# Patient Record
Sex: Male | Born: 1960 | Race: White | Hispanic: No | Marital: Married | Smoking: Never smoker
Health system: Southern US, Community
[De-identification: ages and names within clinical notes are randomized; demographics above are authoritative.]

## PROBLEM LIST (undated history)

## (undated) DIAGNOSIS — D649 Anemia, unspecified: Secondary | ICD-10-CM

## (undated) DIAGNOSIS — T7840XA Allergy, unspecified, initial encounter: Secondary | ICD-10-CM

## (undated) DIAGNOSIS — M5020 Other cervical disc displacement, unspecified cervical region: Secondary | ICD-10-CM

## (undated) DIAGNOSIS — M1990 Unspecified osteoarthritis, unspecified site: Secondary | ICD-10-CM

## (undated) DIAGNOSIS — F419 Anxiety disorder, unspecified: Secondary | ICD-10-CM

## (undated) DIAGNOSIS — K219 Gastro-esophageal reflux disease without esophagitis: Secondary | ICD-10-CM

## (undated) DIAGNOSIS — G4700 Insomnia, unspecified: Secondary | ICD-10-CM

## (undated) DIAGNOSIS — F32A Depression, unspecified: Secondary | ICD-10-CM

## (undated) HISTORY — DX: Gastro-esophageal reflux disease without esophagitis: K21.9

## (undated) HISTORY — DX: Insomnia, unspecified: G47.00

## (undated) HISTORY — DX: Depression, unspecified: F32.A

## (undated) HISTORY — DX: Allergy, unspecified, initial encounter: T78.40XA

## (undated) HISTORY — DX: Anxiety disorder, unspecified: F41.9

## (undated) HISTORY — PX: VASECTOMY: SHX75

## (undated) HISTORY — DX: Unspecified osteoarthritis, unspecified site: M19.90

## (undated) HISTORY — DX: Anemia, unspecified: D64.9

---

## 2020-12-27 ENCOUNTER — Other Ambulatory Visit: Payer: Self-pay

## 2020-12-27 ENCOUNTER — Emergency Department (HOSPITAL_COMMUNITY)
Admission: EM | Admit: 2020-12-27 | Discharge: 2020-12-28 | Disposition: A | Discharge status: Home / Self Care | Payer: Self-pay | Attending: Emergency Medicine | Admitting: Emergency Medicine

## 2020-12-27 ENCOUNTER — Encounter (HOSPITAL_COMMUNITY): Payer: Self-pay

## 2020-12-27 DIAGNOSIS — N201 Calculus of ureter: Secondary | ICD-10-CM | POA: Insufficient documentation

## 2020-12-27 DIAGNOSIS — N1330 Unspecified hydronephrosis: Secondary | ICD-10-CM | POA: Insufficient documentation

## 2020-12-27 DIAGNOSIS — N281 Cyst of kidney, acquired: Secondary | ICD-10-CM | POA: Insufficient documentation

## 2020-12-27 DIAGNOSIS — M549 Dorsalgia, unspecified: Secondary | ICD-10-CM

## 2020-12-27 DIAGNOSIS — R17 Unspecified jaundice: Secondary | ICD-10-CM

## 2020-12-27 DIAGNOSIS — R748 Abnormal levels of other serum enzymes: Secondary | ICD-10-CM | POA: Insufficient documentation

## 2020-12-27 HISTORY — DX: Other cervical disc displacement, unspecified cervical region: M50.20

## 2020-12-27 MED ORDER — ACETAMINOPHEN 325 MG PO TABS
650.0000 mg | ORAL_TABLET | Freq: Once | ORAL | Status: AC
  Administered 2020-12-27: 650 mg via ORAL
  Filled 2020-12-27: qty 2

## 2020-12-27 NOTE — ED Triage Notes (Signed)
Lower back pain radiating to the front when pt turned around in bed. Pt reports its the same feeling when he had a slip disc before. Pain 10/10

## 2020-12-28 ENCOUNTER — Emergency Department (HOSPITAL_COMMUNITY): Payer: Self-pay

## 2020-12-28 LAB — URINALYSIS, ROUTINE W REFLEX MICROSCOPIC
Bacteria, UA: NONE SEEN
Bilirubin Urine: NEGATIVE
Glucose, UA: NEGATIVE mg/dL
Ketones, ur: 80 mg/dL — AB
Leukocytes,Ua: NEGATIVE
Nitrite: NEGATIVE
Protein, ur: NEGATIVE mg/dL
Specific Gravity, Urine: 1.023 (ref 1.005–1.030)
pH: 5 (ref 5.0–8.0)

## 2020-12-28 LAB — COMPREHENSIVE METABOLIC PANEL
ALT: 29 U/L (ref 0–44)
AST: 26 U/L (ref 15–41)
Albumin: 3.7 g/dL (ref 3.5–5.0)
Alkaline Phosphatase: 61 U/L (ref 38–126)
Anion gap: 9 (ref 5–15)
BUN: 11 mg/dL (ref 6–20)
CO2: 24 mmol/L (ref 22–32)
Calcium: 9.3 mg/dL (ref 8.9–10.3)
Chloride: 105 mmol/L (ref 98–111)
Creatinine, Ser: 1.26 mg/dL — ABNORMAL HIGH (ref 0.61–1.24)
GFR, Estimated: >60 mL/min (ref >60)
Glucose, Bld: 108 mg/dL — ABNORMAL HIGH (ref 70–99)
Potassium: 3.8 mmol/L (ref 3.5–5.1)
Sodium: 138 mmol/L (ref 135–145)
Total Bilirubin: 2.7 mg/dL — ABNORMAL HIGH (ref 0.3–1.2)
Total Protein: 5.7 g/dL — ABNORMAL LOW (ref 6.5–8.1)

## 2020-12-28 LAB — CBC WITH DIFFERENTIAL/PLATELET
Abs Immature Granulocytes: 0.05 10*3/uL (ref 0.00–0.07)
Basophils Absolute: 0 10*3/uL (ref 0.0–0.1)
Basophils Relative: 0 %
Eosinophils Absolute: 0 10*3/uL (ref 0.0–0.5)
Eosinophils Relative: 0 %
HCT: 32.5 % — ABNORMAL LOW (ref 39.0–52.0)
Hemoglobin: 10.4 g/dL — ABNORMAL LOW (ref 13.0–17.0)
Immature Granulocytes: 0 %
Lymphocytes Relative: 10 %
Lymphs Abs: 1.1 10*3/uL (ref 0.7–4.0)
MCH: 21.5 pg — ABNORMAL LOW (ref 26.0–34.0)
MCHC: 32 g/dL (ref 30.0–36.0)
MCV: 67.1 fL — ABNORMAL LOW (ref 80.0–100.0)
Monocytes Absolute: 0.6 10*3/uL (ref 0.1–1.0)
Monocytes Relative: 6 %
Neutro Abs: 9.4 10*3/uL — ABNORMAL HIGH (ref 1.7–7.7)
Neutrophils Relative %: 84 %
Platelets: 176 10*3/uL (ref 150–400)
RBC: 4.84 MIL/uL (ref 4.22–5.81)
RDW: 16.9 % — ABNORMAL HIGH (ref 11.5–15.5)
WBC: 11.2 10*3/uL — ABNORMAL HIGH (ref 4.0–10.5)
nRBC: 0.2 % (ref 0.0–0.2)

## 2020-12-28 LAB — LIPASE, BLOOD: Lipase: 55 U/L — ABNORMAL HIGH (ref 11–51)

## 2020-12-28 MED ORDER — HYDROCODONE-ACETAMINOPHEN 7.5-325 MG PO TABS: 1.0000 | ORAL_TABLET | Freq: Four times a day (QID) | ORAL | 0 refills | Status: DC | PRN

## 2020-12-28 MED ORDER — IOHEXOL 300 MG/ML  SOLN
100.0000 mL | Freq: Once | INTRAMUSCULAR | Status: AC | PRN
  Administered 2020-12-28: 100 mL via INTRAVENOUS

## 2020-12-28 MED ORDER — MORPHINE SULFATE (PF) 4 MG/ML IV SOLN
4.0000 mg | Freq: Once | INTRAVENOUS | Status: AC
  Administered 2020-12-28: 4 mg via INTRAVENOUS
  Filled 2020-12-28: qty 1

## 2020-12-28 MED ORDER — ONDANSETRON 4 MG PO TBDP: 4.0000 mg | ORAL_TABLET | Freq: Three times a day (TID) | ORAL | 0 refills | Status: DC | PRN

## 2020-12-28 MED ORDER — SODIUM CHLORIDE 0.9 % IV BOLUS
1000.0000 mL | Freq: Once | INTRAVENOUS | Status: AC
Start: 2020-12-28 — End: 2020-12-28
  Administered 2020-12-28: 1000 mL via INTRAVENOUS

## 2020-12-28 MED ORDER — HYDROCODONE-ACETAMINOPHEN 5-325 MG PO TABS: 1.0000 | ORAL_TABLET | Freq: Four times a day (QID) | ORAL | 0 refills | Status: DC | PRN

## 2020-12-28 MED ORDER — LIDOCAINE 5 % EX PTCH: 1.0000 | MEDICATED_PATCH | CUTANEOUS | 0 refills | Status: DC

## 2020-12-28 MED ORDER — ONDANSETRON HCL 4 MG/2ML IJ SOLN
4.0000 mg | Freq: Once | INTRAMUSCULAR | Status: AC
  Administered 2020-12-28: 4 mg via INTRAVENOUS
  Filled 2020-12-28: qty 2

## 2020-12-28 MED ORDER — TAMSULOSIN HCL 0.4 MG PO CAPS: 0.4000 mg | ORAL_CAPSULE | Freq: Every day | ORAL | 0 refills | Status: DC

## 2020-12-28 NOTE — ED Notes (Signed)
Pt walked to the bathroom well with a steady gait.

## 2020-12-28 NOTE — ED Provider Notes (Signed)
[facility] EMERGENCY DEPARTMENT Provider Note   CSN: [id number] Arrival date & time: 12/27/20  2144    History Chief Complaint  Patient presents with   Back Pain    Fernando Arias is a 60 y.o. male with past medical history significant for prior cervical herniated disc who presents for evaluation of back and abdominal pain.  States yesterday when he was clocking into work he had sudden onset thoracic back pain that radiated to his abdomen.  States he thought this was due to his new job.  Patient states he laid down and his pain persisted.  Rates his pain a 10 out of 10 and described it as sharp and stabbing.  He has never had a pain like this previously.  He assumed he might have pulled a muscle in his back.  He came to the emergency department due to pain being unresolved.  He denies any fever, chills, nausea, vomiting, chest pain, shortness of breath, weakness, paresthesias, lightheadedness or dizziness.  Denies additional aggravating or alleviating factors.  Has any prior history of kidney stones, AAA or dissections.  Patient denies any pain radiating to his bilateral legs.  No history of IV drug use, bowel or bladder incontinence, saddle paresthesias. No lumbar back pain.  History obtained from patient andast medical records.  No interpreter used.  HPI     Past Medical History:  Diagnosis Date   Slipped disc in neck     There are no problems to display for this patient.   History reviewed. No pertinent surgical history.     History reviewed. No pertinent family history.  Social History   Tobacco Use   Smoking status: Never Smoker   Smokeless tobacco: Never Used  Substance Use Topics   Alcohol use: Never   Drug use: Never    Home Medications Prior to Admission medications   Medication Sig Start Date End Date Taking? Authorizing Provider  Aspirin-Salicylamide-Caffeine (BC HEADACHE POWDER PO) Take 1 packet by mouth daily as needed.   Yes  [provider]  calcium carbonate (OS-CAL) 1250 (500 Ca) MG chewable tablet Chew 1 tablet by mouth daily.   Yes [provider]  cholecalciferol (VITAMIN D3) 25 MCG (1000 UNIT) tablet Take 1,000 Units by mouth daily.   Yes [provider]  HYDROcodone-acetaminophen (NORCO) 7.5-325 MG tablet Take 1 tablet by mouth every 6 (six) hours as needed for moderate pain. 12/28/20  Yes Patrese Neal A, PA-C  lidocaine (LIDODERM) 5 % Place 1 patch onto the skin daily. Remove & Discard patch within 12 hours or as directed by MD 12/28/20  Yes Dyan Creelman A, PA-C  Multiple Vitamins-Minerals (CENTRUM SILVER 50+MEN) TABS Take 1 tablet by mouth daily.   Yes [provider]  ondansetron (ZOFRAN ODT) 4 MG disintegrating tablet Take 1 tablet (4 mg total) by mouth every 8 (eight) hours as needed for nausea or vomiting. 12/28/20  Yes Jaxton Casale A, PA-C  tamsulosin (FLOMAX) 0.4 MG CAPS capsule Take 1 capsule (0.4 mg total) by mouth daily. 12/28/20  Yes Fenris Cauble A, PA-C    Allergies    Patient has no known allergies.  Review of Systems   Review of Systems  Constitutional: Negative.   HENT: Negative.   Eyes: Negative.   Respiratory: Negative.   Cardiovascular: Negative.   Gastrointestinal: Positive for abdominal pain. Negative for constipation, diarrhea, nausea, rectal pain and vomiting.  Genitourinary: Negative.   Musculoskeletal: Positive for back pain. Negative for neck pain and neck  stiffness.  Skin: Negative.   Neurological: Negative.   All other systems reviewed and are negative.   Physical Exam Updated Vital Signs BP 138/72    Pulse 76    Temp 97.9 F (36.6 C) (Oral)    Resp 16    Ht  (1.88 m)    Wt 107.5 kg    SpO2 98%    BMI 30.43 kg/m   Physical Exam Vitals and nursing note reviewed.  Constitutional:      General: He is not in acute distress.    Appearance: He is well-developed. He is not ill-appearing, toxic-appearing or diaphoretic.   HENT:     Head: Normocephalic and atraumatic.     Nose: Nose normal.     Mouth/Throat:     Mouth: Mucous membranes are moist.  Eyes:     Pupils: Pupils are equal, round, and reactive to light.  Cardiovascular:     Rate and Rhythm: Normal rate and regular rhythm.     Pulses: Normal pulses.          Radial pulses are 2+ on the right side and 2+ on the left side.       Dorsalis pedis pulses are 2+ on the right side and 2+ on the left side.     Heart sounds: Normal heart sounds.  Pulmonary:     Effort: Pulmonary effort is normal. No respiratory distress.     Breath sounds: Normal breath sounds.  Abdominal:     General: Bowel sounds are normal. There is no distension.     Palpations: Abdomen is soft.     Tenderness: There is generalized abdominal tenderness. There is no right CVA tenderness, left CVA tenderness, guarding or rebound. Negative signs include Murphy's sign and McBurney's sign.     Hernia: No hernia is present.       Comments: Denies abdominal tenderness, or worse to right.  Negative Murphy sign, negative McBurney point.  Old surgical scar without erythema or warmth to right abd  Musculoskeletal:        General: Normal range of motion.     Cervical back: Normal range of motion and neck supple.       Back:     Comments: No midline tenderness. Mid right para spinal thoracic over T10-T12. No tender over lumbar area. No overlying skin changes. Negative straight leg raise.   Feet:     Right foot:     Skin integrity: Skin integrity normal.     Left foot:     Skin integrity: Skin integrity normal.  Skin:    General: Skin is warm and dry.     Capillary Refill: Capillary refill takes less than 2 seconds.     Coloration: Skin is pale.     Comments: No rashes or lesions.  Neurological:     General: No focal deficit present.     Mental Status: He is alert and oriented to person, place, and time.     Cranial Nerves: Cranial nerves are intact.     Sensory: Sensation is intact.      Motor: Motor function is intact.     Gait: Gait is intact.     Comments: Cranial nerves II to grossly intact Ambulatory without difficulty    ED Results / Procedures / Treatments   Labs (all labs ordered are listed, but only abnormal results are displayed) Labs Reviewed  CBC WITH DIFFERENTIAL/PLATELET - Abnormal; Notable for the following components:      Result  Value   WBC 11.2 (*)    Hemoglobin 10.4 (*)    HCT 32.5 (*)    MCV 67.1 (*)    MCH 21.5 (*)    RDW 16.9 (*)    Neutro Abs 9.4 (*)    All other components within normal limits  COMPREHENSIVE METABOLIC PANEL - Abnormal; Notable for the following components:   Glucose, Bld 108 (*)    Creatinine, Ser 1.26 (*)    Total Protein 5.7 (*)    Total Bilirubin 2.7 (*)    All other components within normal limits  LIPASE, BLOOD - Abnormal; Notable for the following components:   Lipase 55 (*)    All other components within normal limits  URINALYSIS, ROUTINE W REFLEX MICROSCOPIC - Abnormal; Notable for the following components:   Hgb urine dipstick MODERATE (*)    Ketones, ur 80 (*)    All other components within normal limits    EKG EKG Interpretation  Date/Time:  Friday December 28 2020 08:08:35 EDT Ventricular Rate:  62 PR Interval:  144 QRS Duration: 104 QT Interval:  438 QTC Calculation: 444 R Axis:   -5 Text Interpretation: Normal sinus rhythm normal, no acute ischemic appearance, no old comparison Confirmed by Arby Barrette [phone]) on 12/28/2020 9:21:15 AM   Radiology CT THORACIC SPINE WO CONTRAST  Result Date: 12/28/2020 CLINICAL DATA:  Diffuse abdominal and back pain.  No known trauma. EXAM: CT THORACIC SPINE WITHOUT CONTRAST TECHNIQUE: Multidetector CT images of the thoracic were obtained using the standard protocol without intravenous contrast. COMPARISON:  None. FINDINGS: Alignment: Normal Vertebrae: No acute bony findings. No fracture or bone lesion. The facets are normally aligned. No facet or lamina  fractures. Moderate degenerative changes with near bridging anterior osteophytes in the mid and lower thoracic spine. Paraspinal and other soft tissues: No significant paraspinal findings. No mediastinal mass or adenopathy. The aorta is normal in caliber. There is moderate distal esophageal wall thickening which could suggest esophagitis. Recommend correlation with any symptoms of dysphagia. Contrast esophagram may be helpful for further evaluation. No definite hiatal hernia. There are simple appearing cysts associated with the right kidney. Right renal calculi are also noted. The visualized lungs are grossly clear. No worrisome pulmonary lesions are identified. The visualized ribs are intact.  No rib fractures. Disc levels: No large thoracic disc protrusions, significant spinal or foraminal stenosis demonstrated. Mild degenerative type spurring changes are noted. IMPRESSION: 1. Normal alignment of the thoracic vertebral bodies and no acute bony findings. 2. Moderate degenerative changes with near bridging anterior osteophytes in the mid and lower thoracic spine. 3. No large thoracic disc protrusions, significant spinal or foraminal stenosis. 4. Moderate distal esophageal wall thickening could suggest esophagitis. Electronically Signed   By: Rudie Meyer M.D.   On: 12/28/2020 08:50   CT ABDOMEN PELVIS W CONTRAST  Result Date: 12/28/2020 CLINICAL DATA:  Acute abdominal pain. EXAM: CT ABDOMEN AND PELVIS WITH CONTRAST TECHNIQUE: Multidetector CT imaging of the abdomen and pelvis was performed using the standard protocol following bolus administration of intravenous contrast. CONTRAST:  OMNIPAQUE IOHEXOL 300 MG/ML  SOLN COMPARISON:  None. FINDINGS: Lower chest: The lung bases are unremarkable. No worrisome pulmonary lesions or pleural effusions. The heart is normal in size. No pericardial effusion. There is moderate distal esophageal wall thickening which could suggest esophagitis. Recommend correlation any  clinical symptoms esophagitis. Contrast esophagram may be helpful for further evaluation. Hepatobiliary: No hepatic lesions or intrahepatic biliary dilatation. I do not see any morphologic findings  for cirrhosis. The gallbladder is unremarkable. No common bile duct dilatation. Pancreas: No mass, inflammation or ductal dilatation. Spleen: Mild splenomegaly. Spleen measures 14 x 14 x 10 cm. No focal lesions. Adrenals/Urinary Tract: The adrenal glands are normal. There are right-sided simple appearing renal cysts. The largest measures 6 cm in the upper pole area. There are also small right renal calculi. No left-sided renal calculi. Pararenal cysts are noted. Moderate right-sided hydronephrosis and mild to moderate right-sided hydroureter down to an obstructing 1-2 mm calculus in the distal ureter located at the mid sacral level. I do not see any findings to suggest pyelonephritis. The bladder is unremarkable. No bladder calculi or findings to suggest cystitis. Stomach/Bowel: The stomach, duodenum, small bowel and colon are grossly normal without oral contrast. No acute inflammatory changes, mass lesions or obstructive findings. The terminal ileum and appendix are normal. There is moderate descending colon and sigmoid colon diverticulosis but no findings for acute diverticulitis. Vascular/Lymphatic: The aorta and branch vessels are patent. Minimal scattered calcifications. The major venous structures are patent. Hazy interstitial changes in the root of the small bowel mesentery and scattered mesenteric nodes consistent with benign mesenteritis or panniculitis. Small scattered retroperitoneal lymph nodes but no mass or adenopathy. Reproductive: The prostate gland and seminal vesicles are unremarkable. Other: No pelvic mass or adenopathy. No free pelvic fluid collections. No inguinal mass or adenopathy. No abdominal wall hernia or subcutaneous lesions. Musculoskeletal: No significant bony findings. Moderate age advanced  degenerative lumbar spondylosis with disc disease and facet disease in the lower lumbar spine. Mild bilateral hip joint degenerative changes are also noted. IMPRESSION: 1. 1-2 mm obstructing distal right ureteral calculus causing moderate right-sided hydronephrosis and mild to moderate right-sided hydroureter. 2. Small right renal calculi. 3. Bilateral renal cysts. 4. Mild splenomegaly. 5. Moderate distal esophageal wall thickening could suggest esophagitis. Recommend correlation any clinical symptoms. 6. Hazy interstitial changes in the root of the small bowel mesentery and scattered mesenteric nodes consistent with benign mesenteritis or panniculitis. Electronically Signed   By: Rudie Meyer M.D.   On: 12/28/2020 09:02    Procedures Procedures   Medications Ordered in ED Medications  acetaminophen (TYLENOL) tablet 650 mg (650 mg Oral Given 12/27/20 2207)  sodium chloride 0.9 % bolus 1,000 mL (0 mLs Intravenous Stopped 12/28/20 0823)  morphine 4 MG/ML injection 4 mg (4 mg Intravenous Given 12/28/20 0726)  ondansetron (ZOFRAN) injection 4 mg (4 mg Intravenous Given 12/28/20 0726)  iohexol (OMNIPAQUE) 300 MG/ML solution 100 mL (100 mLs Intravenous Contrast Given 12/28/20 0825)    ED Course  I have reviewed the triage vital signs and the nursing notes.  Pertinent labs & imaging results that were available during my care of the patient were reviewed by me and considered in my medical decision making (see chart for details).  Unfortunately patient with wait > 9 hours in waiting room prior to being moved back into treatment area.  60 year old here for evaluation of back and abdominal pain.  Started suddenly yesterday.  Described as sharp and stabbing.  He did recently start a new job which requires a lot of physical labor which he is not used to.  Pain radiating from right thoracic to right flank and into abdomen diffusely to the right.  He took Tylenol which mildly relieved the pain.  No paresthesias  or weakness.  No associated chest pain, shortness of breath.  No recent falls, known injuries.  Pain has since onset.  He is afebrile, nonseptic, non-ill-appearing.  He is  neurovascularly intact.  I am minimally able to reproduce his back pain.  Does have diffuse abdominal pain.  Negative Murphy sign, make Burney's point. We will plan on labs, imaging and reassess.  Labs and imaging personally reviewed and interpreted:  CBC leukocytosis at 11.2, Hgb 10.4 no prior to compare, denies melena or BRBPR CMP glucose 108, Creatinine 1.26, T bili 2.7, negative Murphy sign, low suspicion for choledocholithiasis, cholangitis or cholecystitis Lipase 55 EKG without ischemic changes CT AP right stone with hydronephrosis.  Does have renal cyst.  Likely  esophagitis CT thoracic no large central disc pertrusions UA negative for infection, does show blood consistent with stone  Patient reassessed.  Pain controlled.  Ambulatory without difficulty.  Tolerating p.o. intake.  Discussed imaging and labs.  Follow-up with PCP for repeat labs given mildly elevated bilirubin however no evidence of acute gallbladder, pancreas or liver etiology at this time imaging.  He has negative Murphy sign, low suspicion for obstructing stone, acute infectious process.  UA here is negative for infection I have low suspicion for infected stone. Symptoms likely related to ureteral stone.  Reassuring creatinine.  DC home with pain management, Flomax, antiemetic.  Follow-up with Urology.  No chest pain, shortness of breath, neurologic symptoms low suspicion for atypical ACS, PE, dissection, AAA. He is agreeable for this.  Discussed possible side effects of medications as well strict return precautions.  Patient voiced understanding and is agreeable to follow-up.  The patient has been appropriately medically screened and/or stabilized in the ED. I have low suspicion for any other emergent medical condition which would require further screening,  evaluation or treatment in the ED or require inpatient management.  Patient is hemodynamically stable and in no acute distress.  Patient able to ambulate in department prior to ED.  Evaluation does not show acute pathology that would require ongoing or additional emergent interventions while in the emergency department or further inpatient treatment.  I have discussed the diagnosis with the patient and answered all questions.  Pain is been managed while in the emergency department and patient has no further complaints prior to discharge.  Patient is comfortable with plan discussed in room and is stable for discharge at this time.  I have discussed strict return precautions for returning to the emergency department.  Patient was encouraged to follow-up with PCP/specialist refer to at discharge.    MDM Rules/Calculators/A&P                           Final Clinical Impression(s) / ED Diagnoses Final diagnoses:  Back pain  Right ureteral stone  Hydronephrosis, unspecified hydronephrosis type  Elevated lipase  Elevated bilirubin  Renal cyst    Rx / DC Orders ED Discharge Orders         Ordered    HYDROcodone-acetaminophen (NORCO/VICODIN) 5-325 MG tablet  Every 6 hours PRN,   Status:  Discontinued        12/28/20 0925    lidocaine (LIDODERM) 5 %  Every 24 hours        12/28/20 0925    tamsulosin (FLOMAX) 0.4 MG CAPS capsule  Daily        12/28/20 0925    ondansetron (ZOFRAN ODT) 4 MG disintegrating tablet  Every 8 hours PRN        12/28/20 0928    HYDROcodone-acetaminophen (NORCO) 7.5-325 MG tablet  Every 6 hours PRN        12/28/20 0932  Ayisha Pol A, PA-C 12/28/20 1610    Arby Barrette, MD 12/28/20 [phone]

## 2020-12-28 NOTE — Discharge Instructions (Signed)
Your labs today did show a mildly elevated bilirubin level.  This is of unclear cause.  You will need to follow-up with a primary care provider for this.  Your CT scan did show that you have a right-sided kidney stone this is likely the cause of your pain.  He also had cysts in both of your kidneys however these appear benign.  Your urinalysis did not show any evidence of infection

## 2020-12-28 NOTE — ED Notes (Signed)
Pt denies numbness and tingling. Pt denies urinary issues.

## 2020-12-28 NOTE — ED Notes (Signed)
Pt seen ambulatory to bathroom with steady gait.

## 2020-12-28 NOTE — ED Notes (Signed)
Patient transported to CT 

## 2021-12-09 ENCOUNTER — Encounter: Payer: Self-pay | Admitting: Family Medicine

## 2021-12-23 ENCOUNTER — Ambulatory Visit: Payer: Self-pay | Admitting: Physician Assistant

## 2021-12-23 ENCOUNTER — Encounter: Payer: Self-pay | Admitting: Physician Assistant

## 2021-12-23 ENCOUNTER — Other Ambulatory Visit: Payer: Self-pay

## 2021-12-23 ENCOUNTER — Ambulatory Visit (INDEPENDENT_AMBULATORY_CARE_PROVIDER_SITE_OTHER): Payer: No Typology Code available for payment source | Admitting: Physician Assistant

## 2021-12-23 VITALS — BP 162/102 | HR 66 | Ht 74.0 in | Wt 212.4 lb

## 2021-12-23 DIAGNOSIS — G4700 Insomnia, unspecified: Secondary | ICD-10-CM | POA: Insufficient documentation

## 2021-12-23 DIAGNOSIS — R351 Nocturia: Secondary | ICD-10-CM | POA: Insufficient documentation

## 2021-12-23 DIAGNOSIS — R030 Elevated blood-pressure reading, without diagnosis of hypertension: Secondary | ICD-10-CM | POA: Diagnosis not present

## 2021-12-23 DIAGNOSIS — F339 Major depressive disorder, recurrent, unspecified: Secondary | ICD-10-CM | POA: Insufficient documentation

## 2021-12-23 DIAGNOSIS — Z114 Encounter for screening for human immunodeficiency virus [HIV]: Secondary | ICD-10-CM

## 2021-12-23 DIAGNOSIS — Z862 Personal history of diseases of the blood and blood-forming organs and certain disorders involving the immune mechanism: Secondary | ICD-10-CM | POA: Insufficient documentation

## 2021-12-23 DIAGNOSIS — M129 Arthropathy, unspecified: Secondary | ICD-10-CM | POA: Diagnosis not present

## 2021-12-23 DIAGNOSIS — Z1331 Encounter for screening for depression: Secondary | ICD-10-CM

## 2021-12-23 DIAGNOSIS — Z0000 Encounter for general adult medical examination without abnormal findings: Secondary | ICD-10-CM | POA: Diagnosis not present

## 2021-12-23 DIAGNOSIS — F331 Major depressive disorder, recurrent, moderate: Secondary | ICD-10-CM | POA: Insufficient documentation

## 2021-12-23 DIAGNOSIS — Z1159 Encounter for screening for other viral diseases: Secondary | ICD-10-CM

## 2021-12-23 DIAGNOSIS — N401 Enlarged prostate with lower urinary tract symptoms: Secondary | ICD-10-CM | POA: Insufficient documentation

## 2021-12-23 DIAGNOSIS — Z23 Encounter for immunization: Secondary | ICD-10-CM

## 2021-12-23 DIAGNOSIS — Z1211 Encounter for screening for malignant neoplasm of colon: Secondary | ICD-10-CM

## 2021-12-23 MED ORDER — HYDROXYZINE PAMOATE 50 MG PO CAPS: 50.0000 mg | ORAL_CAPSULE | Freq: Every day | ORAL | 1 refills | Status: DC

## 2021-12-23 MED ORDER — TAMSULOSIN HCL 0.4 MG PO CAPS: 0.4000 mg | ORAL_CAPSULE | Freq: Every day | ORAL | 1 refills | Status: DC

## 2021-12-23 NOTE — Assessment & Plan Note (Signed)
Trial of hydroxyzine instead of otc unisom or nyquil. ?Rx 50 mg, but can start with 25 mg before sleeping.  ? ?

## 2021-12-23 NOTE — Assessment & Plan Note (Addendum)
Pt states ' my whole life'. 3/22 when pt was in ED, H/H was 10.4/32.5 ?Will recheck cbc, iron panel, vit b12, folate ?

## 2021-12-23 NOTE — Assessment & Plan Note (Signed)
Would rather not manage with BC powders, would prefer tylenol, but pt finds the most benefit from [facility] powders ?

## 2021-12-23 NOTE — Assessment & Plan Note (Signed)
H/o kidney stones ?May be a bph component ?Will check psa ?rx flomax before bed ?

## 2021-12-23 NOTE — Progress Notes (Signed)
I,Sha'taria Tyson,acting as a Education administrator for [employer], PA-C.,have documented all relevant documentation on the behalf of Mikey Kirschner, PA-C,as directed by  Mikey Kirschner, PA-C while in the presence of Mikey Kirschner, PA-C.   Complete physical exam   Patient: Fernando Arias   DOB: [birth year]   61 y.o. Male  MRN: [id number] Visit Date: 12/23/2021  Today's healthcare provider: Mikey Kirschner, PA-C   Cc. cpe  Subjective    Fernando Arias is a 61 y.o. male who presents today for a complete physical exam.  He reports consuming a general diet. He generally feels well. He reports sleeping fairly well. He does have additional problems to discuss today.   Reports arthritis of multiple joints, namely his left elbow and right knee. Manages with BC powders. Denies active erythema, edema, effusion.  Reports insomnia d/t frequent changes in day/night. Currently manages with OTC unisom or nyquil. He also reports having to urinate at night every 2-3 hours. Denies dysuria or hematuria.    His largest concern is current tension with his wife d/t some ongoing mental health issues. He attributes all his mental health concerns to this relationship. Denies SI, HI.    Past Medical History:  Diagnosis Date   Allergy    Anemia    Anxiety    Arthritis    Depression    GERD (gastroesophageal reflux disease)    Slipped disc in neck    Past Surgical History:  Procedure Laterality Date   VASECTOMY     Social History   Socioeconomic History   Marital status: Married    Spouse name: Not on file   Number of children: Not on file   Years of education: Not on file   Highest education level: Not on file  Occupational History   Not on file  Tobacco Use   Smoking status: Never   Smokeless tobacco: Never  Substance and Sexual Activity   Alcohol use: Never   Drug use: Never   Sexual activity: Not on file  Other Topics Concern   Not on file  Social History Narrative   Not on file   Social  Determinants of Health   Financial Resource Strain: Not on file  Food Insecurity: Not on file  Transportation Needs: Not on file  Physical Activity: Not on file  Stress: Not on file  Social Connections: Not on file  Intimate Partner Violence: Not on file   Family Status  Relation Name Status   Mother  Deceased   Sister  Alive   Sister  Alive   Brother  Alive   MGM  Deceased   MGF  Deceased   Family History  Problem Relation Age of Onset   Diabetes Mother    GER disease Sister    GER disease Sister    GER disease Brother    Diabetes Maternal Grandmother    Cancer - Lung Maternal Grandfather    No Known Allergies  Patient Care Team: Mikey Kirschner, PA-C as PCP - General (Physician Assistant)   Medications: Outpatient Medications Prior to Visit  Medication Sig   Aspirin-Salicylamide-Caffeine (BC HEADACHE POWDER PO) Take 1 packet by mouth daily as needed.   calcium carbonate (OS-CAL) 1250 (500 Ca) MG chewable tablet Chew 1 tablet by mouth daily.   cholecalciferol (VITAMIN D3) 25 MCG (1000 UNIT) tablet Take 1,000 Units by mouth daily.   Multiple Vitamins-Minerals (CENTRUM SILVER 50+MEN) TABS Take 1 tablet by mouth daily.   [DISCONTINUED] HYDROcodone-acetaminophen (NORCO) 7.5-325 MG tablet Take 1  tablet by mouth every 6 (six) hours as needed for moderate pain. (Patient not taking: Reported on 12/23/2021)   [DISCONTINUED] lidocaine (LIDODERM) 5 % Place 1 patch onto the skin daily. Remove & Discard patch within 12 hours or as directed by MD (Patient not taking: Reported on 12/23/2021)   [DISCONTINUED] ondansetron (ZOFRAN ODT) 4 MG disintegrating tablet Take 1 tablet (4 mg total) by mouth every 8 (eight) hours as needed for nausea or vomiting. (Patient not taking: Reported on 12/23/2021)   [DISCONTINUED] tamsulosin (FLOMAX) 0.4 MG CAPS capsule Take 1 capsule (0.4 mg total) by mouth daily. (Patient not taking: Reported on 12/23/2021)   No facility-administered medications prior to  visit.    Review of Systems  Constitutional:  Negative for fatigue and fever.  Respiratory:  Negative for cough and shortness of breath.   Cardiovascular:  Negative for chest pain, palpitations and leg swelling.  Genitourinary:  Positive for frequency and urgency.  Musculoskeletal:  Positive for arthralgias, joint swelling and myalgias.  Neurological:  Negative for dizziness and headaches.  Psychiatric/Behavioral:  Positive for sleep disturbance. The patient is nervous/anxious.      Objective    Blood pressure (!) 162/102, pulse 66, height 6\' 2"  (1.88 m), weight 212 lb 6.4 oz (96.3 kg), SpO2 100 %.    Physical Exam Constitutional:      General: He is awake.     Appearance: He is well-developed.  HENT:     Head: Normocephalic.     Right Ear: Tympanic membrane, ear canal and external ear normal.     Left Ear: Tympanic membrane, ear canal and external ear normal.     Nose: Nose normal. No congestion or rhinorrhea.     Mouth/Throat:     Mouth: Mucous membranes are moist.     Pharynx: No oropharyngeal exudate or posterior oropharyngeal erythema.  Eyes:     Pupils: Pupils are equal, round, and reactive to light.  Cardiovascular:     Rate and Rhythm: Normal rate and regular rhythm.     Heart sounds: Normal heart sounds.  Pulmonary:     Effort: Pulmonary effort is normal.     Breath sounds: Normal breath sounds.  Abdominal:     General: There is no distension.     Palpations: Abdomen is soft.     Tenderness: There is no abdominal tenderness. There is no guarding.  Musculoskeletal:     Cervical back: Normal range of motion.     Right lower leg: No edema.     Left lower leg: No edema.  Lymphadenopathy:     Cervical: No cervical adenopathy.  Skin:    General: Skin is warm.  Neurological:     Mental Status: He is alert and oriented to person, place, and time.  Psychiatric:        Attention and Perception: Attention normal.        Mood and Affect: Mood normal.        Speech:  Speech normal.        Behavior: Behavior normal. Behavior is cooperative.     Last depression screening scores PHQ 2/9 Scores 12/23/2021  PHQ - 2 Score 6  PHQ- 9 Score 19   Last fall risk screening Fall Risk  12/23/2021  Falls in the past year? 0  Number falls in past yr: 0  Injury with Fall? 0  Risk for fall due to : No Fall Risks   Last Audit-C alcohol use screening Alcohol Use Disorder Test (AUDIT) 12/23/2021  1. How often do you have a drink containing alcohol? 1  2. How many drinks containing alcohol do you have on a typical day when you are drinking? 0  3. How often do you have six or more drinks on one occasion? 0  AUDIT-C Score 1   A score of 3 or more in women, and 4 or more in men indicates increased risk for alcohol abuse, EXCEPT if all of the points are from question 1   No results found for any visits on 12/23/21.  Assessment & Plan    Routine Health Maintenance and Physical Exam  Exercise Activities and Dietary recommendations --balanced diet high in fiber, protein, and low in fats, sugars, carbs Immunization History  Administered Date(s) Administered   PFIZER Comirnaty(Gray Top)Covid-19 Tri-Sucrose Vaccine 07/05/2020   Td 12/23/2021   Zoster Recombinat (Shingrix) 12/23/2021    Health Maintenance  Topic Date Due   Hepatitis C Screening  Never done   COVID-19 Vaccine (2 - Pfizer risk series) 07/26/2020   INFLUENZA VACCINE  01/10/2022 (Originally 05/13/2021)   COLONOSCOPY (Pts 45-64yrs Insurance coverage will need to be confirmed)  12/24/2022 (Originally 11/03/2006)   Zoster Vaccines- Shingrix (2 of 2) 02/17/2022   TETANUS/TDAP  12/24/2031   HIV Screening  Completed   HPV VACCINES  Aged Out    Discussed health benefits of physical activity, and encouraged him to engage in regular exercise appropriate for his age and condition.  Problem List Items Addressed This Visit       Musculoskeletal and Integument   Arthritis, multiple joint involvement    Would  rather not manage with BC powders, would prefer tylenol, but pt finds the most benefit from [facility] powders        Other   Insomnia    Trial of hydroxyzine instead of otc unisom or nyquil. Rx 50 mg, but can start with 25 mg before sleeping.        Relevant Medications   hydrOXYzine (VISTARIL) 50 MG capsule   Other Relevant Orders   TSH + free T4   History of anemia    Pt states ' my whole life'. 3/22 when pt was in ED, H/H was 10.4/32.5 Will recheck cbc, iron panel, vit b12, folate      Relevant Orders   CBC   Vitamin B12   Iron, TIBC and Ferritin Panel   Folate   Nocturia    H/o kidney stones May be a bph component Will check psa rx flomax before bed      Relevant Medications   tamsulosin (FLOMAX) 0.4 MG CAPS capsule   Other Relevant Orders   PSA   Positive depression screening    Will discuss at next visit in more depth      Elevated blood pressure reading    Likely will need to start HTN medication, would start w. Lisinopril. Pt hesitant with starting medication off of today's values Should start checking bp at home Advised f/u 1 mo for htn      Other Visit Diagnoses     Encounter for health maintenance examination    -  Primary   Relevant Orders   Comprehensive Metabolic Panel (CMET)   Lipid Profile   CBC   PSA   TSH + free T4   HgB A1c   Encounter for screening for HIV       Relevant Orders   HIV antibody (with reflex)   Encounter for hepatitis C screening test for low risk patient  Relevant Orders   Hepatitis C antibody   Encounter for screening for malignant neoplasm of colon       Relevant Orders   Cologuard   Need for shingles vaccine       Relevant Orders   Varicella-zoster vaccine IM (Shingrix) (Completed)   Need for Td vaccine       Relevant Orders   Td : Tetanus/diphtheria >60yo Preservative  free (Completed)       Discussion with colonoscopy vs cologuard, pt declines colonoscopy but agrees to home cologuard test.  Return in  about 1 month (around 01/23/2022) for hypertension.     I, Mikey Kirschner, PA-C have reviewed all documentation for this visit. The documentation on  12/23/2021 for the exam, diagnosis, procedures, and orders are all accurate and complete.  Mikey Kirschner, PA-C [facility] [address] [place], [place], [address] Office: [phone] Fax: [place]

## 2021-12-23 NOTE — Patient Instructions (Addendum)
[facility] ?[address], [place], [place] [address] ?

## 2021-12-23 NOTE — Assessment & Plan Note (Addendum)
Likely will need to start HTN medication, would start w. Lisinopril. Pt hesitant with starting medication off of today's values ?Should start checking bp at home ?Advised f/u 1 mo for htn ?

## 2021-12-23 NOTE — Assessment & Plan Note (Signed)
Will discuss at next visit in more depth ?

## 2021-12-24 ENCOUNTER — Other Ambulatory Visit: Payer: Self-pay

## 2021-12-24 DIAGNOSIS — Z862 Personal history of diseases of the blood and blood-forming organs and certain disorders involving the immune mechanism: Secondary | ICD-10-CM

## 2021-12-24 DIAGNOSIS — D649 Anemia, unspecified: Secondary | ICD-10-CM

## 2021-12-24 LAB — COMPREHENSIVE METABOLIC PANEL
ALT: 22 IU/L (ref 0–44)
AST: 21 IU/L (ref 0–40)
Albumin/Globulin Ratio: 2 (ref 1.2–2.2)
Albumin: 4.4 g/dL (ref 3.8–4.9)
Alkaline Phosphatase: 90 IU/L (ref 44–121)
BUN/Creatinine Ratio: 16 (ref 10–24)
BUN: 17 mg/dL (ref 8–27)
Bilirubin Total: 1.5 mg/dL — ABNORMAL HIGH (ref 0.0–1.2)
CO2: 26 mmol/L (ref 20–29)
Calcium: 9.5 mg/dL (ref 8.6–10.2)
Chloride: 104 mmol/L (ref 96–106)
Creatinine, Ser: 1.07 mg/dL (ref 0.76–1.27)
Globulin, Total: 2.2 g/dL (ref 1.5–4.5)
Glucose: 78 mg/dL (ref 70–99)
Potassium: 4.2 mmol/L (ref 3.5–5.2)
Sodium: 143 mmol/L (ref 134–144)
Total Protein: 6.6 g/dL (ref 6.0–8.5)
eGFR: 79 mL/min/{1.73_m2} (ref >59)

## 2021-12-24 LAB — CBC
Hematocrit: 38.7 % (ref 37.5–51.0)
Hemoglobin: 12 g/dL — ABNORMAL LOW (ref 13.0–17.7)
MCH: 21.1 pg — ABNORMAL LOW (ref 26.6–33.0)
MCHC: 31 g/dL — ABNORMAL LOW (ref 31.5–35.7)
MCV: 68 fL — ABNORMAL LOW (ref 79–97)
Platelets: 228 10*3/uL (ref 150–450)
RBC: 5.69 x10E6/uL (ref 4.14–5.80)
RDW: 16.1 % — ABNORMAL HIGH (ref 11.6–15.4)
WBC: 7.4 10*3/uL (ref 3.4–10.8)

## 2021-12-24 LAB — VITAMIN B12: Vitamin B-12: 909 pg/mL (ref 232–1245)

## 2021-12-24 LAB — LIPID PANEL
Chol/HDL Ratio: 3 ratio (ref 0.0–5.0)
Cholesterol, Total: 148 mg/dL (ref 100–199)
HDL: 50 mg/dL (ref >39)
LDL Chol Calc (NIH): 84 mg/dL (ref 0–99)
Triglycerides: 69 mg/dL (ref 0–149)
VLDL Cholesterol Cal: 14 mg/dL (ref 5–40)

## 2021-12-24 LAB — HEMOGLOBIN A1C
Est. average glucose Bld gHb Est-mCnc: 88 mg/dL
Hgb A1c MFr Bld: 4.7 % — ABNORMAL LOW (ref 4.8–5.6)

## 2021-12-24 LAB — IRON,TIBC AND FERRITIN PANEL
Ferritin: 372 ng/mL (ref 30–400)
Iron Saturation: 61 % — ABNORMAL HIGH (ref 15–55)
Iron: 137 ug/dL (ref 38–169)
Total Iron Binding Capacity: 223 ug/dL — ABNORMAL LOW (ref 250–450)
UIBC: 86 ug/dL — ABNORMAL LOW (ref 111–343)

## 2021-12-24 LAB — FOLATE: Folate: >20 ng/mL (ref >3.0)

## 2021-12-24 LAB — TSH+FREE T4
Free T4: 1.16 ng/dL (ref 0.82–1.77)
TSH: 1.14 u[IU]/mL (ref 0.450–4.500)

## 2021-12-24 LAB — HEPATITIS C ANTIBODY: Hep C Virus Ab: NONREACTIVE

## 2021-12-24 LAB — PSA: Prostate Specific Ag, Serum: 0.9 ng/mL (ref 0.0–4.0)

## 2021-12-24 LAB — HIV ANTIBODY (ROUTINE TESTING W REFLEX): HIV Screen 4th Generation wRfx: NONREACTIVE

## 2021-12-31 ENCOUNTER — Encounter: Payer: Self-pay | Admitting: *Deleted

## 2022-01-07 ENCOUNTER — Other Ambulatory Visit: Payer: Self-pay

## 2022-01-07 ENCOUNTER — Inpatient Hospital Stay: Payer: No Typology Code available for payment source | Attending: Oncology | Admitting: Oncology

## 2022-01-07 ENCOUNTER — Inpatient Hospital Stay: Payer: No Typology Code available for payment source

## 2022-01-07 ENCOUNTER — Encounter: Payer: Self-pay | Admitting: Oncology

## 2022-01-07 DIAGNOSIS — D649 Anemia, unspecified: Secondary | ICD-10-CM | POA: Insufficient documentation

## 2022-01-07 DIAGNOSIS — Z801 Family history of malignant neoplasm of trachea, bronchus and lung: Secondary | ICD-10-CM | POA: Diagnosis not present

## 2022-01-07 DIAGNOSIS — K219 Gastro-esophageal reflux disease without esophagitis: Secondary | ICD-10-CM | POA: Diagnosis not present

## 2022-01-07 DIAGNOSIS — F419 Anxiety disorder, unspecified: Secondary | ICD-10-CM | POA: Diagnosis not present

## 2022-01-07 LAB — CBC WITH DIFFERENTIAL/PLATELET
Abs Immature Granulocytes: 0.02 10*3/uL (ref 0.00–0.07)
Basophils Absolute: 0 10*3/uL (ref 0.0–0.1)
Basophils Relative: 1 %
Eosinophils Absolute: 0.1 10*3/uL (ref 0.0–0.5)
Eosinophils Relative: 1 %
HCT: 37.2 % — ABNORMAL LOW (ref 39.0–52.0)
Hemoglobin: 12 g/dL — ABNORMAL LOW (ref 13.0–17.0)
Immature Granulocytes: 0 %
Lymphocytes Relative: 25 %
Lymphs Abs: 1.6 10*3/uL (ref 0.7–4.0)
MCH: 21.3 pg — ABNORMAL LOW (ref 26.0–34.0)
MCHC: 32.3 g/dL (ref 30.0–36.0)
MCV: 66.1 fL — ABNORMAL LOW (ref 80.0–100.0)
Monocytes Absolute: 0.5 10*3/uL (ref 0.1–1.0)
Monocytes Relative: 8 %
Neutro Abs: 4.1 10*3/uL (ref 1.7–7.7)
Neutrophils Relative %: 65 %
Platelets: 199 10*3/uL (ref 150–400)
RBC: 5.63 MIL/uL (ref 4.22–5.81)
RDW: 15.8 % — ABNORMAL HIGH (ref 11.5–15.5)
WBC: 6.4 10*3/uL (ref 4.0–10.5)
nRBC: 0 % (ref 0.0–0.2)

## 2022-01-07 LAB — LACTATE DEHYDROGENASE: LDH: 102 U/L (ref 98–192)

## 2022-01-07 NOTE — Progress Notes (Signed)
?[facility]  ?[phone] [phone] ? ?ID: [name] OB: 27-Feb-1961  MR#: [id number]  EZB#:[id number] ? ?Patient Care Team: ?Burnett Corrente as PCP - General (Physician Assistant) ?Jeralyn Ruths, MD as Consulting Physician (Hematology and Oncology) ? ?CHIEF COMPLAINT: Anemia, unspecified. ? ?INTERVAL HISTORY: Patient is a 61 year old male who was noted to have a mild anemia on routine blood work and referred for further evaluation.  Currently, he feels well and is asymptomatic.  He has no neurologic complaints. He denies and recent fevers or illnesses.  He has a good appetite and denies weight loss. He has no chest pain, shortness of breath, cough, or hemoptysis. He denies any nausea, vomiting, constipation, or diarrhea. He has no urinary complaints.  Patient feels at his baseline and offers no specific complaints today. ? ?REVIEW OF SYSTEMS:   ?Review of Systems  ?Constitutional: Negative.  Negative for fever, malaise/fatigue and weight loss.  ?Respiratory: Negative.  Negative for cough, hemoptysis and shortness of breath.   ?Cardiovascular: Negative.  Negative for chest pain and leg swelling.  ?Gastrointestinal: Negative.  Negative for abdominal pain.  ?Genitourinary: Negative.  Negative for dysuria.  ?Musculoskeletal: Negative.  Negative for back pain.  ?Skin: Negative.  Negative for rash.  ?Neurological: Negative.  Negative for dizziness, focal weakness, weakness and headaches.  ?Psychiatric/Behavioral: Negative.  The patient is not nervous/anxious.   ? ?As per HPI. Otherwise, a complete review of systems is negative. ? ?PAST MEDICAL HISTORY: ?Past Medical History:  ?Diagnosis Date  ? Allergy   ? Anemia   ? Anxiety   ? Arthritis   ? Depression   ? GERD (gastroesophageal reflux disease)   ? Insomnia   ? Slipped disc in neck   ? ? ?PAST SURGICAL HISTORY: ?Past Surgical History:  ?Procedure Laterality Date  ? VASECTOMY    ? ? ?FAMILY HISTORY: ?Family History   ?Problem Relation Age of Onset  ? Diabetes Mother   ? GER disease Sister   ? GER disease Sister   ? GER disease Brother   ? Diabetes Maternal Grandmother   ? Cancer - Lung Maternal Grandfather   ? ? ?ADVANCED DIRECTIVES (Y/N):  N ? ?HEALTH MAINTENANCE: ?Social History  ? ?Tobacco Use  ? Smoking status: Never  ? Smokeless tobacco: Never  ?Substance Use Topics  ? Alcohol use: Never  ? Drug use: Never  ? ? ? Colonoscopy: ? PAP: ? Bone density: ? Lipid panel: ? ?No Known Allergies ? ?Current Outpatient Medications  ?Medication Sig Dispense Refill  ? Aspirin-Salicylamide-Caffeine (BC HEADACHE POWDER PO) Take 1 packet by mouth daily as needed.    ? calcium carbonate (OS-CAL) 1250 (500 Ca) MG chewable tablet Chew 1 tablet by mouth daily.    ? cholecalciferol (VITAMIN D3) 25 MCG (1000 UNIT) tablet Take 1,000 Units by mouth daily.    ? hydrOXYzine (VISTARIL) 50 MG capsule Take 1 capsule (50 mg total) by mouth at bedtime. 30 capsule 1  ? Multiple Vitamins-Minerals (CENTRUM SILVER 50+MEN) TABS Take 1 tablet by mouth daily.    ? tamsulosin (FLOMAX) 0.4 MG CAPS capsule Take 1 capsule (0.4 mg total) by mouth daily. 90 capsule 1  ? ?No current facility-administered medications for this visit.  ? ? ?OBJECTIVE: ?Vitals:  ? 01/07/22 1502  ?BP: (!) 158/107  ?Pulse: 81  ?Resp: 16  ?Temp: (!) 97.5 ?F (36.4 ?C)  ?SpO2: 100%  ?   Body mass index is 26.96 kg/m?Marland Kitchen    ECOG FS:0 - Asymptomatic ? ?General:  Well-developed, well-nourished, no acute distress. ?Eyes: Pink conjunctiva, anicteric sclera. ?HEENT: Normocephalic, moist mucous membranes. ?Lungs: No audible wheezing or coughing. ?Heart: Regular rate and rhythm. ?Abdomen: Soft, nontender, no obvious distention. ?Musculoskeletal: No edema, cyanosis, or clubbing. ?Neuro: Alert, answering all questions appropriately. Cranial nerves grossly intact. ?Skin: No rashes or petechiae noted. ?Psych: Normal affect. ?Lymphatics: No cervical, calvicular, axillary or inguinal LAD. ? ? ?LAB  RESULTS: ? ?Lab Results  ?Component Value Date  ? NA 143 12/23/2021  ? K 4.2 12/23/2021  ? CL 104 12/23/2021  ? CO2 26 12/23/2021  ? GLUCOSE 78 12/23/2021  ? BUN 17 12/23/2021  ? CREATININE 1.07 12/23/2021  ? CALCIUM 9.5 12/23/2021  ? PROT 6.6 12/23/2021  ? ALBUMIN 4.4 12/23/2021  ? AST 21 12/23/2021  ? ALT 22 12/23/2021  ? ALKPHOS 90 12/23/2021  ? BILITOT 1.5 (H) 12/23/2021  ? GFRNONAA >60 12/28/2020  ? ? ?Lab Results  ?Component Value Date  ? WBC 6.4 01/07/2022  ? NEUTROABS 4.1 01/07/2022  ? HGB 12.0 (L) 01/07/2022  ? HCT 37.2 (L) 01/07/2022  ? MCV 66.1 (L) 01/07/2022  ? PLT 199 01/07/2022  ? ? ? ?STUDIES: ?No results found. ? ?ASSESSMENT: Anemia, unspecified. ? ?PLAN:   ? ?Anemia, unspecified: Patient's hemoglobin is essentially normal at 12.0.  He was noted to have a microcytosis and hemoglobinopathy profile has been sent for completeness.  B12, folate, and hemolysis labs are all within normal limits.  Patient does not have iron deficiency, but was noted to have an increased iron saturation level and hemochromatosis profile has also been sent.  These are pending at time of dictation.  No intervention is needed at this time.  Patient will have a video assisted telemedicine visit in 3 weeks to discuss his results. ? ?I spent a total of 45 minutes reviewing chart data, face-to-face evaluation with the patient, counseling and coordination of care as detailed above. ? ? ?Patient expressed understanding and was in agreement with this plan. He also understands that He can call clinic at any time with any questions, concerns, or complaints.  ? ? ?Jeralyn Ruths, MD   01/09/2022 8:36 AM ? ? ? ? ?

## 2022-01-08 LAB — HAPTOGLOBIN: Haptoglobin: 40 mg/dL (ref 29–370)

## 2022-01-09 LAB — HGB FRACTIONATION CASCADE
Hgb A2: 5.3 % — ABNORMAL HIGH (ref 1.8–3.2)
Hgb A: 94.4 % — ABNORMAL LOW (ref 96.4–98.8)
Hgb F: 0.3 % (ref 0.0–2.0)
Hgb S: 0 %

## 2022-01-13 LAB — HEMOCHROMATOSIS DNA-PCR(C282Y,H63D)

## 2022-01-15 ENCOUNTER — Other Ambulatory Visit: Payer: Self-pay | Admitting: Physician Assistant

## 2022-01-15 DIAGNOSIS — G4700 Insomnia, unspecified: Secondary | ICD-10-CM

## 2022-01-27 NOTE — Progress Notes (Signed)
?[facility]  ?Telephone:([phone]) [phone] [phone] ? ?ID: Fernando Arias OB: 02/01/1961  MR#: [id number]  SFK#:[id number] ? ?Patient Care Team: ?Burnett Corrente as PCP - General (Physician Assistant) ?Jeralyn Ruths, MD as Consulting Physician (Hematology and Oncology) ? ?I connected with Fernando Arias on 01/28/22 at  2:15 PM EDT by video enabled telemedicine visit and verified that I am speaking with the correct person using two identifiers.  ? ?I discussed the limitations, risks, security and privacy concerns of performing an evaluation and management service by telemedicine and the availability of in-person appointments. I also discussed with the patient that there may be a patient responsible charge related to this service. The patient expressed understanding and agreed to proceed.  ? ?Other persons participating in the visit and their role in the encounter: Patient, MD. ? ?Patient?s location: Home. ?Provider?s location: Clinic. ? ?CHIEF COMPLAINT: Beta thalassemia minor, hemochromatosis. ? ?INTERVAL HISTORY: Patient agreed to video assisted telemedicine visit for further evaluation and discussion of his laboratory results.  He has chronic weakness and fatigue, but otherwise feels well.  He has no neurologic complaints. He denies and recent fevers or illnesses.  He has a good appetite and denies weight loss. He has no chest pain, shortness of breath, cough, or hemoptysis. He denies any nausea, vomiting, constipation, or diarrhea. He has no urinary complaints.  Patient offers no further specific complaints today. ? ?REVIEW OF SYSTEMS:   ?Review of Systems  ?Constitutional:  Positive for malaise/fatigue. Negative for fever and weight loss.  ?Respiratory: Negative.  Negative for cough, hemoptysis and shortness of breath.   ?Cardiovascular: Negative.  Negative for chest pain and leg swelling.  ?Gastrointestinal: Negative.  Negative for abdominal pain.  ?Genitourinary: Negative.   Negative for dysuria.  ?Musculoskeletal: Negative.  Negative for back pain.  ?Skin: Negative.  Negative for rash.  ?Neurological:  Positive for weakness. Negative for dizziness, focal weakness and headaches.  ?Psychiatric/Behavioral: Negative.  The patient is not nervous/anxious.   ? ?As per HPI. Otherwise, a complete review of systems is negative. ? ?PAST MEDICAL HISTORY: ?Past Medical History:  ?Diagnosis Date  ? Allergy   ? Anemia   ? Anxiety   ? Arthritis   ? Depression   ? GERD (gastroesophageal reflux disease)   ? Insomnia   ? Slipped disc in neck   ? ? ?PAST SURGICAL HISTORY: ?Past Surgical History:  ?Procedure Laterality Date  ? VASECTOMY    ? ? ?FAMILY HISTORY: ?Family History  ?Problem Relation Age of Onset  ? Diabetes Mother   ? GER disease Sister   ? GER disease Sister   ? GER disease Brother   ? Diabetes Maternal Grandmother   ? Cancer - Lung Maternal Grandfather   ? ? ?ADVANCED DIRECTIVES (Y/N):  N ? ?HEALTH MAINTENANCE: ?Social History  ? ?Tobacco Use  ? Smoking status: Never  ? Smokeless tobacco: Never  ?Substance Use Topics  ? Alcohol use: Never  ? Drug use: Never  ? ? ? Colonoscopy: ? PAP: ? Bone density: ? Lipid panel: ? ?No Known Allergies ? ?Current Outpatient Medications  ?Medication Sig Dispense Refill  ? Aspirin-Salicylamide-Caffeine (BC HEADACHE POWDER PO) Take 1 packet by mouth daily as needed.    ? calcium carbonate (OS-CAL) 1250 (500 Ca) MG chewable tablet Chew 1 tablet by mouth daily.    ? cholecalciferol (VITAMIN D3) 25 MCG (1000 UNIT) tablet Take 1,000 Units by mouth daily.    ? hydrOXYzine (VISTARIL) 50 MG capsule TAKE 1 CAPSULE  BY MOUTH AT BEDTIME. 90 capsule 1  ? Multiple Vitamins-Minerals (CENTRUM SILVER 50+MEN) TABS Take 1 tablet by mouth daily.    ? tamsulosin (FLOMAX) 0.4 MG CAPS capsule Take 1 capsule (0.4 mg total) by mouth daily. 90 capsule 1  ? ?No current facility-administered medications for this visit.  ? ? ?OBJECTIVE: ?There were no vitals filed for this visit. ?   There  is no height or weight on file to calculate BMI.    ECOG FS:0 - Asymptomatic ? ?General: Well-developed, well-nourished, no acute distress. ?HEENT: Normocephalic. ?Neuro: Alert, answering all questions appropriately. Cranial nerves grossly intact. ?Psych: Normal affect. ? ? ?LAB RESULTS: ? ?Lab Results  ?Component Value Date  ? NA 143 12/23/2021  ? K 4.2 12/23/2021  ? CL 104 12/23/2021  ? CO2 26 12/23/2021  ? GLUCOSE 78 12/23/2021  ? BUN 17 12/23/2021  ? CREATININE 1.07 12/23/2021  ? CALCIUM 9.5 12/23/2021  ? PROT 6.6 12/23/2021  ? ALBUMIN 4.4 12/23/2021  ? AST 21 12/23/2021  ? ALT 22 12/23/2021  ? ALKPHOS 90 12/23/2021  ? BILITOT 1.5 (H) 12/23/2021  ? GFRNONAA >60 12/28/2020  ? ? ?Lab Results  ?Component Value Date  ? WBC 6.4 01/07/2022  ? NEUTROABS 4.1 01/07/2022  ? HGB 12.0 (L) 01/07/2022  ? HCT 37.2 (L) 01/07/2022  ? MCV 66.1 (L) 01/07/2022  ? PLT 199 01/07/2022  ? ? ? ?STUDIES: ?No results found. ? ?ASSESSMENT: Beta thalassemia minor, hemochromatosis. ? ?PLAN:   ? ?Beta thalassemia minor: Confirmed by hemoglobinopathy profile.  Patient has a mild microcytic anemia secondary to his thalassemia.  All of his other laboratory work including B12, folate, and hemolysis labs are either negative or within normal limits.  No intervention is needed at this time.  No further follow-up has been scheduled. ?Hemochromatosis: Patient also noted to have elevated iron stores and was found to have hemochromatosis as well.  He does not require phlebotomy at this time, but if his iron saturation or ferritin continues to rise, would recommend treatment at that point.   ? ?I provided 20 minutes of face-to-face video visit time during this encounter which included chart review, counseling, and coordination of care as documented above. ? ? ?Patient expressed understanding and was in agreement with this plan. He also understands that He can call clinic at any time with any questions, concerns, or complaints.  ? ? ?Jeralyn Ruths,  MD   01/28/2022 3:46 PM ? ? ? ? ?

## 2022-01-28 ENCOUNTER — Inpatient Hospital Stay: Payer: No Typology Code available for payment source | Attending: Oncology | Admitting: Oncology

## 2022-01-28 DIAGNOSIS — D563 Thalassemia minor: Secondary | ICD-10-CM | POA: Diagnosis not present

## 2022-01-28 DIAGNOSIS — D649 Anemia, unspecified: Secondary | ICD-10-CM

## 2022-01-28 NOTE — Progress Notes (Signed)
Pt would like to discuss beta thalassemia trait. No other questions or concerns at this time. ?

## 2022-02-03 ENCOUNTER — Ambulatory Visit: Payer: No Typology Code available for payment source | Admitting: Physician Assistant

## 2022-02-03 ENCOUNTER — Encounter: Payer: Self-pay | Admitting: Physician Assistant

## 2022-02-03 VITALS — BP 145/86 | HR 60 | Ht 74.0 in | Wt 221.1 lb

## 2022-02-03 DIAGNOSIS — Z1331 Encounter for screening for depression: Secondary | ICD-10-CM

## 2022-02-03 DIAGNOSIS — I10 Essential (primary) hypertension: Secondary | ICD-10-CM

## 2022-02-03 DIAGNOSIS — M129 Arthropathy, unspecified: Secondary | ICD-10-CM | POA: Diagnosis not present

## 2022-02-03 DIAGNOSIS — G4700 Insomnia, unspecified: Secondary | ICD-10-CM

## 2022-02-03 DIAGNOSIS — M25561 Pain in right knee: Secondary | ICD-10-CM

## 2022-02-03 DIAGNOSIS — J3089 Other allergic rhinitis: Secondary | ICD-10-CM | POA: Diagnosis not present

## 2022-02-03 DIAGNOSIS — G8929 Other chronic pain: Secondary | ICD-10-CM

## 2022-02-03 MED ORDER — AZELASTINE HCL 0.1 % NA SOLN: 1.0000 | Freq: Two times a day (BID) | NASAL | 6 refills | Status: DC

## 2022-02-03 MED ORDER — LISINOPRIL 10 MG PO TABS: 10.0000 mg | ORAL_TABLET | Freq: Every day | ORAL | 3 refills | Status: DC

## 2022-02-03 MED ORDER — DULOXETINE HCL 30 MG PO CPEP: 30.0000 mg | ORAL_CAPSULE | Freq: Every day | ORAL | 3 refills | Status: DC

## 2022-02-03 MED ORDER — MELOXICAM 15 MG PO TABS: 15.0000 mg | ORAL_TABLET | Freq: Every day | ORAL | 1 refills | Status: DC

## 2022-02-03 NOTE — Progress Notes (Signed)
?  ? ? ?Established patient visit ? ? ?Patient: Fernando Arias   DOB: June 10, 1961   61 y.o. Male  MRN: [id number] ?Visit Date: 02/03/2022 ? ?Today's healthcare provider: Mikey Kirschner, PA-C  ? ?Cc. HTN and depression f/u ? ?Subjective  ?  ?HPI  ?Hypertension, follow-up ? ?BP Readings from Last 3 Encounters:  ?02/03/22 (!) 145/86  ?01/07/22 (!) 158/107  ?12/23/21 (!) 162/102  ? Wt Readings from Last 3 Encounters:  ?02/03/22 221 lb 1.6 oz (100.3 kg)  ?01/07/22 210 lb (95.3 kg)  ?12/23/21 212 lb 6.4 oz (96.3 kg)  ?  ? ?He was last seen for hypertension a month ago, and was instructed to take his blood pressure at home, which he has not done.  ?He is following a Regular diet. ?He is exercising. ?He does not smoke. ? ?Use of agents associated with hypertension: none.  ? ?Outside blood pressures are not being checked ?Symptoms: ?No chest pain No chest pressure  ?No palpitations No syncope  ?No dyspnea No orthopnea  ?No paroxysmal nocturnal dyspnea No lower extremity edema  ? ?Pertinent labs ?Lab Results  ?Component Value Date  ? CHOL 148 12/23/2021  ? HDL 50 12/23/2021  ? [place] 84 12/23/2021  ? TRIG 69 12/23/2021  ? CHOLHDL 3.0 12/23/2021  ? Lab Results  ?Component Value Date  ? NA 143 12/23/2021  ? K 4.2 12/23/2021  ? CREATININE 1.07 12/23/2021  ? EGFR 79 12/23/2021  ? GLUCOSE 78 12/23/2021  ? TSH 1.140 12/23/2021  ?  ? ?The 10-year ASCVD risk score (Arnett DK, et al., 2019) is: 9.5% ? ?------------------------------------------------------------------------------------------------ ?Insomnia ?-Vistaril kept him asleep too long. But he has on hand if he does want to sleep longer. ? ?Arthritis pain ?-Right knee, left elbow bother him the most, but feels multiple joint involvement. Right knee is particularly bothersome. ? ?Depression ?-Concerns over finances, overall world outlook, wife. Previously had taken antidepressants and disliked. ? ?Post nasal drip  ?-all the time, worse now with allergies. ? ? ?  02/03/2022  ?  8:13  AM 12/23/2021  ?  9:44 AM  ?PHQ9 SCORE ONLY  ?PHQ-9 Total Score 21 19  ? ? ?  12/23/2021  ?  9:44 AM  ?GAD 7 : Generalized Anxiety Score  ?Nervous, Anxious, on Edge 1  ?Control/stop worrying 1  ?Worry too much - different things 2  ?Trouble relaxing 0  ?Restless 0  ?Easily annoyed or irritable 2  ?Afraid - awful might happen 1  ?Total GAD 7 Score 7  ?Anxiety Difficulty Somewhat difficult  ? ?  ?Medications: ?Outpatient Medications Prior to Visit  ?Medication Sig  ? Aspirin-Salicylamide-Caffeine (BC HEADACHE POWDER PO) Take 1 packet by mouth daily as needed.  ? calcium carbonate (OS-CAL) 1250 (500 Ca) MG chewable tablet Chew 1 tablet by mouth daily.  ? cholecalciferol (VITAMIN D3) 25 MCG (1000 UNIT) tablet Take 1,000 Units by mouth daily.  ? Multiple Vitamins-Minerals (CENTRUM SILVER 50+MEN) TABS Take 1 tablet by mouth daily.  ? tamsulosin (FLOMAX) 0.4 MG CAPS capsule Take 1 capsule (0.4 mg total) by mouth daily.  ? hydrOXYzine (VISTARIL) 50 MG capsule TAKE 1 CAPSULE BY MOUTH AT BEDTIME.  ? ?No facility-administered medications prior to visit.  ? ? ?Review of Systems  ?Constitutional:  Negative for fatigue and fever.  ?HENT:  Positive for congestion and postnasal drip.   ?Respiratory:  Negative for cough and shortness of breath.   ?Cardiovascular:  Negative for chest pain, palpitations and leg swelling.  ?Neurological:  Negative  for dizziness and headaches.  ?Psychiatric/Behavioral:  Positive for sleep disturbance. The patient is nervous/anxious.   ? ? ?  Objective  ?  ?Blood pressure (!) 145/86, pulse 60, height _0  (1.88 m), weight 221 lb 1.6 oz (100.3 kg), SpO2 100 %.  ? ?Physical Exam ?Constitutional:   ?   General: He is awake.  ?   Appearance: He is well-developed.  ?HENT:  ?   Head: Normocephalic.  ?Eyes:  ?   Conjunctiva/sclera: Conjunctivae normal.  ?Cardiovascular:  ?   Rate and Rhythm: Normal rate and regular rhythm.  ?   Heart sounds: Normal heart sounds.  ?Pulmonary:  ?   Effort: Pulmonary effort is  normal.  ?   Breath sounds: Normal breath sounds.  ?Skin: ?   General: Skin is warm.  ?Neurological:  ?   Mental Status: He is alert and oriented to person, place, and time.  ?Psychiatric:     ?   Attention and Perception: Attention normal.     ?   Mood and Affect: Mood is depressed. Affect is angry.     ?   Speech: Speech normal.     ?   Behavior: Behavior is cooperative.     ?   Thought Content: Thought content normal.     ?   Cognition and Memory: Cognition normal.  ?  ? ?No results found for any visits on 02/03/22. ? Assessment & Plan  ?  ? ?Problem List Items Addressed This Visit   ? ?  ? Cardiovascular and Mediastinum  ? Primary hypertension - Primary  ?  As pt is not compliant with taking BP at home, I would like to start lisinopril 10 mg daily. ?F/u 2-3 mo ? ?  ?  ? Relevant Medications  ? lisinopril (ZESTRIL) 10 MG tablet  ?  ? Respiratory  ? Non-seasonal allergic rhinitis  ?  Discussed trying azelastine nasal spray daily ? ?  ?  ? Relevant Medications  ? azelastine (ASTELIN) 0.1 % nasal spray  ?  ? Musculoskeletal and Integument  ? Arthritis, multiple joint involvement  ?  Ref to ortho for knee, possible candidate for injections ?Discussed PRN mobic daily ?Discussed trial of cymbalta 30 mg, can increase to 60 mg after 3-4 weeks ? ?  ?  ? Relevant Medications  ? DULoxetine (CYMBALTA) 30 MG capsule  ? meloxicam (MOBIC) 15 MG tablet  ?  ? Other  ? Insomnia  ?  Did not get a chance to discuss.  ?Pt can take 1/2 tab vistaril  ? ?  ?  ? Positive depression screening  ?  Discussed causes, pt not open to therapy or medication ?Did mention cymbalta may have effect on mood, etc, pt open to trying ? ?  ?  ? Relevant Medications  ? DULoxetine (CYMBALTA) 30 MG capsule  ? ?Other Visit Diagnoses   ? ? Chronic pain of right knee      ? Relevant Orders  ? Ambulatory referral to Orthopedics  ? ?  ?  ? ?Return in about 3 months (around 05/05/2022) for hypertension.  ?   ? ?I, Mikey Kirschner, PA-C have reviewed all  documentation for this visit. The documentation on  02/03/2022 for the exam, diagnosis, procedures, and orders are all accurate and complete. ? ?Mikey Kirschner, PA-C ?[facility] ?[id number] ?[place], [place], [address] ?Office: [phone] ?Fax: [phone]  ? ?[facility] ? ?

## 2022-02-03 NOTE — Assessment & Plan Note (Signed)
As pt is not compliant with taking BP at home, I would like to start lisinopril 10 mg daily. ?F/u 2-3 mo ?

## 2022-02-03 NOTE — Assessment & Plan Note (Signed)
Ref to ortho for knee, possible candidate for injections ?Discussed PRN mobic daily ?Discussed trial of cymbalta 30 mg, can increase to 60 mg after 3-4 weeks ?

## 2022-02-03 NOTE — Assessment & Plan Note (Addendum)
Discussed causes, pt not open to therapy or medication ?Did mention cymbalta may have effect on mood, etc, pt open to trying ?

## 2022-02-03 NOTE — Assessment & Plan Note (Signed)
Discussed trying azelastine nasal spray daily ?

## 2022-02-03 NOTE — Assessment & Plan Note (Signed)
Did not get a chance to discuss.  ?Pt can take 1/2 tab vistaril  ?

## 2022-04-19 IMAGING — CT CT ABD-PELV W/ CM
2 of 5 series · 14 of 36 positions shown, 17 images · IV contrast (Omni 300)
Comparison: None.

CLINICAL DATA: Acute abdominal pain.

EXAM:
CT ABDOMEN AND PELVIS WITH CONTRAST
TECHNIQUE: Multidetector CT imaging of the abdomen and pelvis was performed
using the standard protocol following bolus administration of
intravenous contrast.
CONTRAST:  100mL OMNIPAQUE IOHEXOL 300 MG/ML  SOLN

[Series 3: a/p w/ 5mm · axial · 0.79mm/px · z∈[-712,-202]mm · 11 of 116 slices shown, 14 images]
[im 7/116  mediastinal]
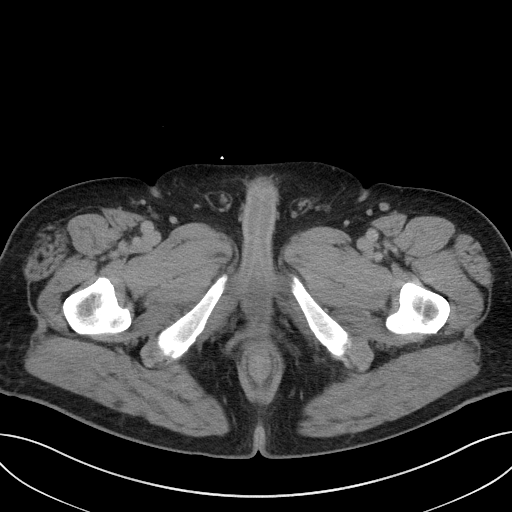
[im 7/116  lung]
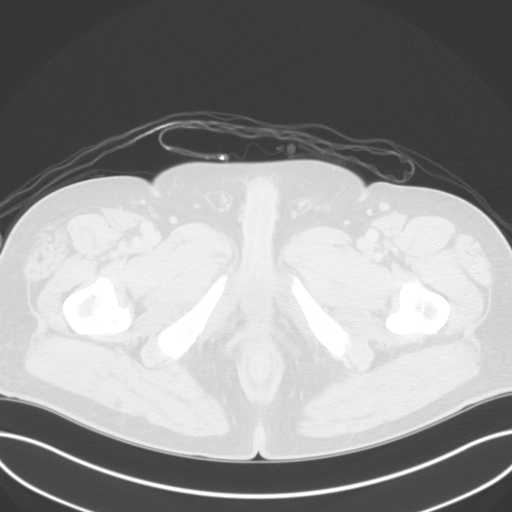
[im 21/116  lung]
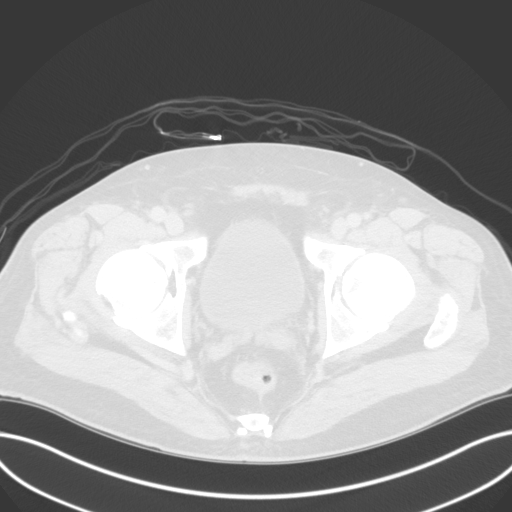
[im 28/116  lung]
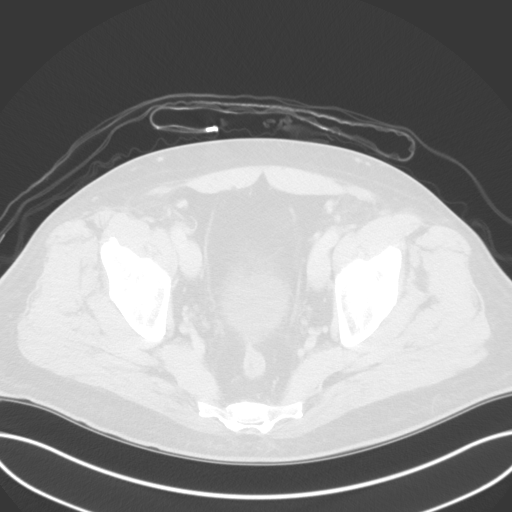
[im 41/116  lung]
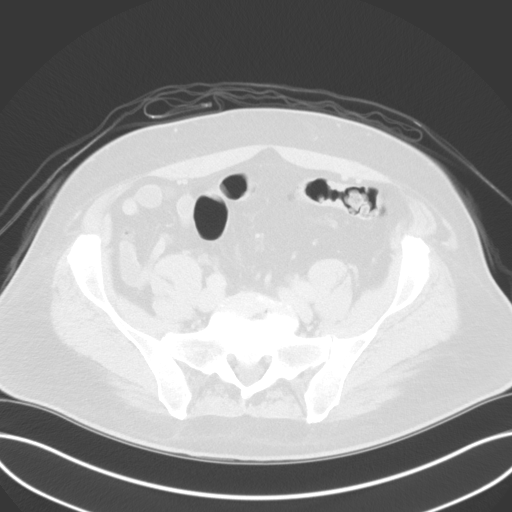
[im 48/116  mediastinal]
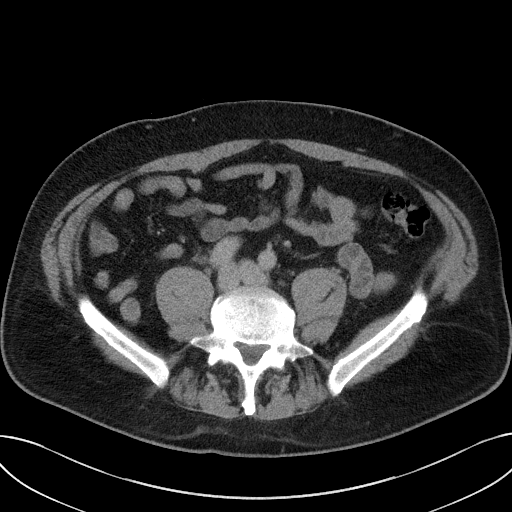
[im 48/116  lung]
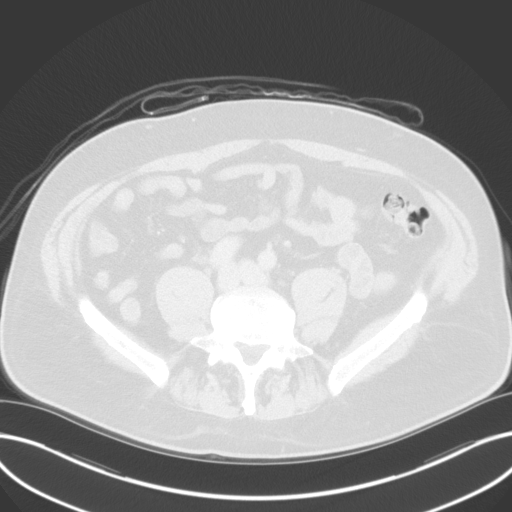
[im 61/116  lung]
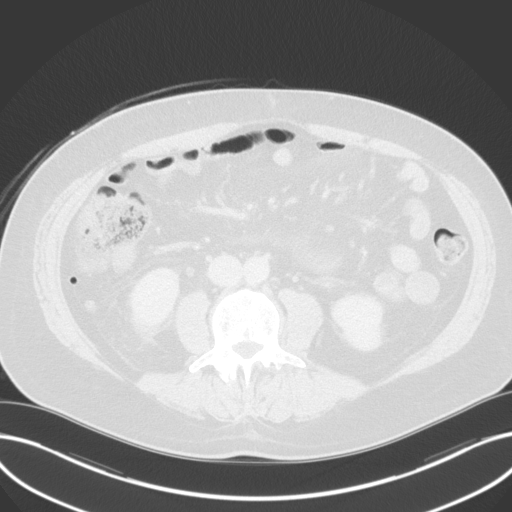
[im 68/116  lung]
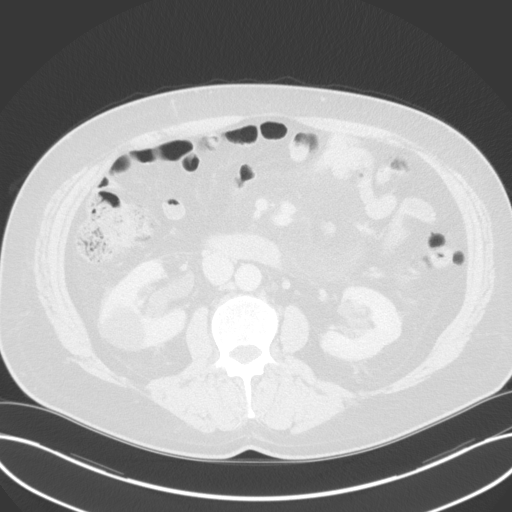
[im 75/116  lung]
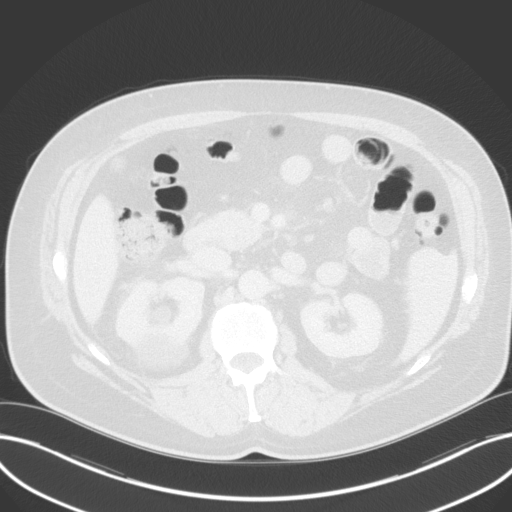
[im 88/116  mediastinal]
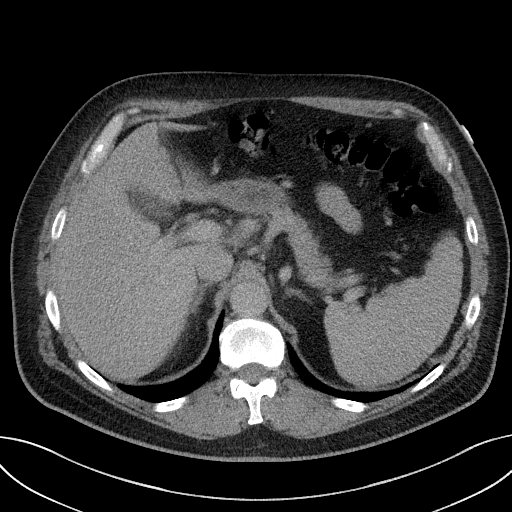
[im 88/116  lung]
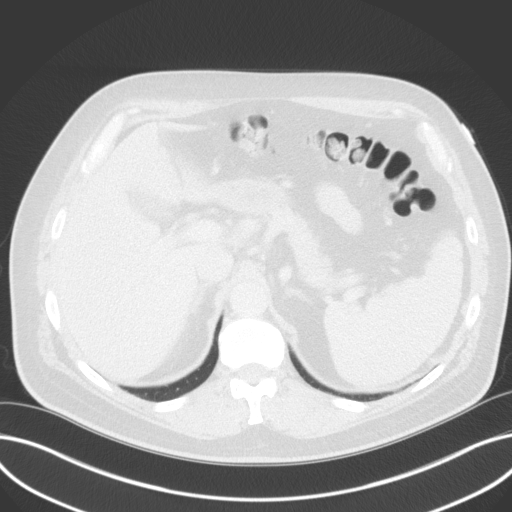
[im 95/116  lung]
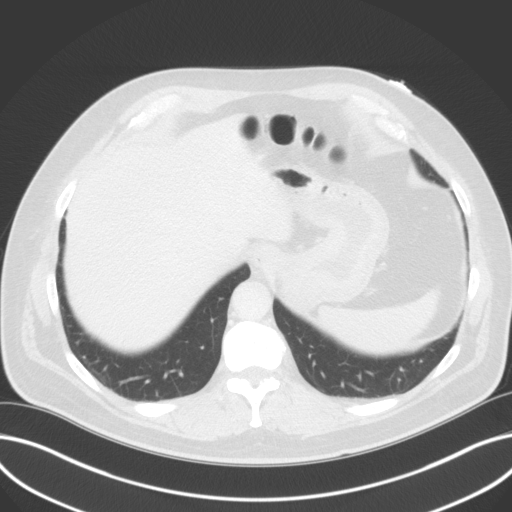
[im 109/116  lung]
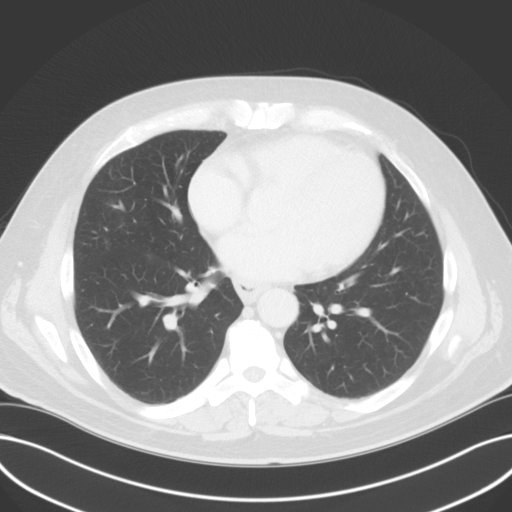

[Series 6: a/p w/ cor · coronal · 0.88mm/px · 3 of 169 slices shown]
[im 34/169  lung]
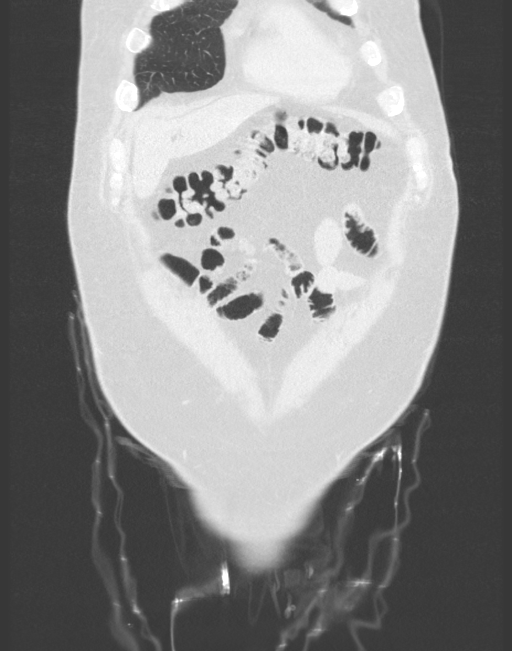
[im 68/169  lung]
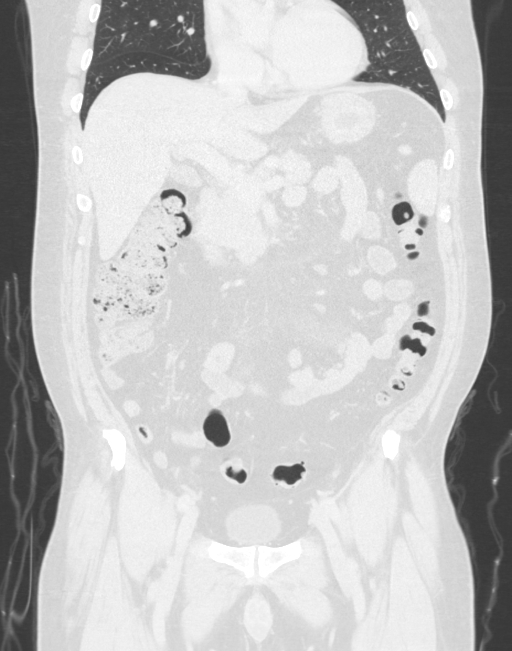
[im 101/169  lung]
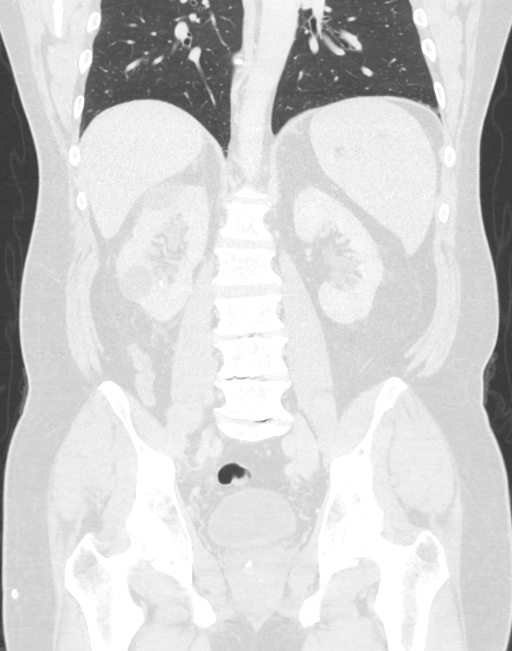

[14 of 36 positions shown; findings below may reference images not displayed]

FINDINGS: Lower chest: The lung bases are unremarkable. No worrisome pulmonary
lesions or pleural effusions. The heart is normal in size. No
pericardial effusion. There is moderate distal esophageal wall
thickening which could suggest esophagitis. Recommend correlation
any clinical symptoms esophagitis. Contrast esophagram may be
helpful for further evaluation.

Hepatobiliary: No hepatic lesions or intrahepatic biliary
dilatation. I do not see any morphologic findings for cirrhosis. The
gallbladder is unremarkable. No common bile duct dilatation.

Pancreas: No mass, inflammation or ductal dilatation.

Spleen: Mild splenomegaly. Spleen measures 14 x 14 x 10 cm. No focal
lesions.

Adrenals/Urinary Tract: The adrenal glands are normal.

There are right-sided simple appearing renal cysts. The largest
measures 6 cm in the upper pole area. There are also small right
renal calculi. No left-sided renal calculi. Pararenal cysts are
noted.

Moderate right-sided hydronephrosis and mild to moderate right-sided
hydroureter down to an obstructing 1-2 mm calculus in the distal
ureter located at the mid sacral level. I do not see any findings to
suggest pyelonephritis.

The bladder is unremarkable. No bladder calculi or findings to
suggest cystitis.

Stomach/Bowel: The stomach, duodenum, small bowel and colon are
grossly normal without oral contrast. No acute inflammatory changes,
mass lesions or obstructive findings. The terminal ileum and
appendix are normal. There is moderate descending colon and sigmoid
colon diverticulosis but no findings for acute diverticulitis.

Vascular/Lymphatic: The aorta and branch vessels are patent. Minimal
scattered calcifications. The major venous structures are patent.

Hazy interstitial changes in the root of the small bowel mesentery
and scattered mesenteric nodes consistent with benign mesenteritis
or panniculitis. Small scattered retroperitoneal lymph nodes but no
mass or adenopathy.

Reproductive: The prostate gland and seminal vesicles are
unremarkable.

Other: No pelvic mass or adenopathy. No free pelvic fluid
collections. No inguinal mass or adenopathy. No abdominal wall
hernia or subcutaneous lesions.

Musculoskeletal: No significant bony findings. Moderate age advanced
degenerative lumbar spondylosis with disc disease and facet disease
in the lower lumbar spine. Mild bilateral hip joint degenerative
changes are also noted.
IMPRESSION: 1. 1-2 mm obstructing distal right ureteral calculus causing
moderate right-sided hydronephrosis and mild to moderate right-sided
hydroureter.
2. Small right renal calculi.
3. Bilateral renal cysts.
4. Mild splenomegaly.
5. Moderate distal esophageal wall thickening could suggest
esophagitis. Recommend correlation any clinical symptoms.
6. Hazy interstitial changes in the root of the small bowel
mesentery and scattered mesenteric nodes consistent with benign
mesenteritis or panniculitis.

## 2022-05-12 ENCOUNTER — Ambulatory Visit (INDEPENDENT_AMBULATORY_CARE_PROVIDER_SITE_OTHER): Payer: No Typology Code available for payment source | Admitting: Physician Assistant

## 2022-05-12 ENCOUNTER — Encounter: Payer: Self-pay | Admitting: Physician Assistant

## 2022-05-12 VITALS — BP 122/80 | HR 61 | Temp 98.2°F | Resp 16 | Wt 220.8 lb

## 2022-05-12 DIAGNOSIS — I10 Essential (primary) hypertension: Secondary | ICD-10-CM | POA: Diagnosis not present

## 2022-05-12 DIAGNOSIS — M129 Arthropathy, unspecified: Secondary | ICD-10-CM | POA: Diagnosis not present

## 2022-05-12 DIAGNOSIS — Z23 Encounter for immunization: Secondary | ICD-10-CM

## 2022-05-12 MED ORDER — DULOXETINE HCL 30 MG PO CPEP: 60.0000 mg | ORAL_CAPSULE | Freq: Every day | ORAL | 1 refills | Status: DC

## 2022-05-12 NOTE — Assessment & Plan Note (Signed)
Now controlled w/ lisinopril 10 mg Continue medications F/u 6 mo

## 2022-05-12 NOTE — Progress Notes (Signed)
Established patient visit  I,Joseline E Rosas,acting as a scribe for [employer], PA-C.,have documented all relevant documentation on the behalf of Mikey Kirschner, PA-C,as directed by  Mikey Kirschner, PA-C while in the presence of Mikey Kirschner, PA-C.   Patient: Fernando Arias   DOB: 17-Sep-1961   61 y.o. Male  MRN: [id number] Visit Date: 05/12/2022  Today's healthcare provider: Mikey Kirschner, PA-C   Chief Complaint  Patient presents with   Follow-up HTN   Subjective    HPI   Reports arthritis pain is improved w/ Cymbalta daily. Taking 60 mg daily.   Hypertension, follow-up  BP Readings from Last 3 Encounters:  05/12/22 122/80  02/03/22 (!) 145/86  01/07/22 (!) 158/107   Wt Readings from Last 3 Encounters:  05/12/22 220 lb 12.8 oz (100.2 kg)  02/03/22 221 lb 1.6 oz (100.3 kg)  01/07/22 210 lb (95.3 kg)     He was last seen for hypertension 3 months ago.  BP at that visit was 145/86. Management since that visit includes Lisinopril 10 mg daily.  He reports excellent compliance with treatment. He is not having side effects.  He is following a Regular diet. He is exercising. He does not smoke.  Outside blood pressures are not being checked. Symptoms: No chest pain No chest pressure  No palpitations No syncope  No dyspnea No orthopnea  No paroxysmal nocturnal dyspnea No lower extremity edema   Pertinent labs Lab Results  Component Value Date   CHOL 148 12/23/2021   HDL 50 12/23/2021   LDLCALC 84 12/23/2021   TRIG 69 12/23/2021   CHOLHDL 3.0 12/23/2021   Lab Results  Component Value Date   NA 143 12/23/2021   K 4.2 12/23/2021   CREATININE 1.07 12/23/2021   EGFR 79 12/23/2021   GLUCOSE 78 12/23/2021   TSH 1.140 12/23/2021     The 10-year ASCVD risk score (Arnett DK, et al., 2019) is: 7%  ---------------------------------------------------------------------------------------------------   Medications: Outpatient Medications Prior to Visit   Medication Sig   Aspirin-Salicylamide-Caffeine (BC HEADACHE POWDER PO) Take 1 packet by mouth daily as needed.   calcium carbonate (OS-CAL) 1250 (500 Ca) MG chewable tablet Chew 1 tablet by mouth daily.   cholecalciferol (VITAMIN D3) 25 MCG (1000 UNIT) tablet Take 1,000 Units by mouth daily.   hydrOXYzine (VISTARIL) 50 MG capsule TAKE 1 CAPSULE BY MOUTH AT BEDTIME.   lisinopril (ZESTRIL) 10 MG tablet Take 1 tablet (10 mg total) by mouth daily.   meloxicam (MOBIC) 15 MG tablet Take 1 tablet (15 mg total) by mouth daily.   Multiple Vitamins-Minerals (CENTRUM SILVER 50+MEN) TABS Take 1 tablet by mouth daily.   tamsulosin (FLOMAX) 0.4 MG CAPS capsule Take 1 capsule (0.4 mg total) by mouth daily.   [DISCONTINUED] DULoxetine (CYMBALTA) 30 MG capsule Take 1 capsule (30 mg total) by mouth daily. Take for one month and then can increase to 60 mg daily   azelastine (ASTELIN) 0.1 % nasal spray Place 1 spray into both nostrils 2 (two) times daily. Use in each nostril as directed   No facility-administered medications prior to visit.    Review of Systems  Constitutional:  Negative for fatigue and fever.  Respiratory:  Negative for cough and shortness of breath.   Cardiovascular:  Negative for chest pain, palpitations and leg swelling.  Neurological:  Negative for dizziness and headaches.       Objective    BP 122/80 (BP Location: Right Arm, Patient Position: Sitting, Cuff Size: Large)  Pulse 61   Temp 98.2 F (36.8 C) (Oral)   Resp 16   Wt 220 lb 12.8 oz (100.2 kg)   BMI 28.35 kg/m   Physical Exam Constitutional:      General: He is awake.     Appearance: He is well-developed.  HENT:     Head: Normocephalic.  Eyes:     Conjunctiva/sclera: Conjunctivae normal.  Cardiovascular:     Rate and Rhythm: Normal rate and regular rhythm.     Heart sounds: Normal heart sounds.  Pulmonary:     Effort: Pulmonary effort is normal.     Breath sounds: Normal breath sounds.  Skin:    General:  Skin is warm.  Neurological:     Mental Status: He is alert and oriented to person, place, and time.  Psychiatric:        Attention and Perception: Attention normal.        Mood and Affect: Mood normal.        Speech: Speech normal.        Behavior: Behavior is cooperative.      No results found for any visits on 05/12/22.  Assessment & Plan     Problem List Items Addressed This Visit       Cardiovascular and Mediastinum   Primary hypertension - Primary    Now controlled w/ lisinopril 10 mg Continue medications F/u 6 mo        Musculoskeletal and Integument   Arthritis, multiple joint involvement    Improved with Cymbalta 60 mg daily      Relevant Medications   DULoxetine (CYMBALTA) 30 MG capsule   Other Visit Diagnoses     Need for shingles vaccine       Relevant Orders   Zoster Recombinant (Shingrix ) (Completed)      Pt declines colonoscopy/cologuard as he is against the medical model of screening for disease and is hesitant to receive a bill.  Return in about 6 months (around 11/12/2022) for hypertension, CPE.      I, Mikey Kirschner, PA-C have reviewed all documentation for this visit. The documentation on  05/12/2022 for the exam, diagnosis, procedures, and orders are all accurate and complete.  Mikey Kirschner, PA-C [facility] [address] [place], [place], [address] Office: [phone] Fax: [place]

## 2022-05-12 NOTE — Assessment & Plan Note (Signed)
Improved with Cymbalta 60 mg daily

## 2022-05-19 ENCOUNTER — Other Ambulatory Visit: Payer: Self-pay | Admitting: Physician Assistant

## 2022-05-19 DIAGNOSIS — M129 Arthropathy, unspecified: Secondary | ICD-10-CM

## 2022-05-19 NOTE — Telephone Encounter (Signed)
Medication Refill - Medication:  Duloxtine   30 mg  #180  Has the patient contacted their pharmacy? Yes.   (Agent: If no, request that the patient contact the pharmacy for the refill. If patient does not wish to contact the pharmacy document the reason why and proceed with request.) (Agent: If yes, when and what did the pharmacy advise?)  Preferred Pharmacy (with phone number or street name): [facility] Has the patient been seen for an appointment in the last year OR does the patient have an upcoming appointment? Yes.    Agent: Please be advised that RX refills may take up to 3 business days. We ask that you follow-up with your pharmacy.

## 2022-05-20 MED ORDER — DULOXETINE HCL 30 MG PO CPEP: 60.0000 mg | ORAL_CAPSULE | Freq: Every day | ORAL | 1 refills | Status: DC

## 2022-05-20 NOTE — Telephone Encounter (Signed)
Pt asking for 90 day refillls Requested Prescriptions  Pending Prescriptions Disp Refills  . DULoxetine (CYMBALTA) 30 MG capsule 180 capsule 1    Sig: Take 2 capsules (60 mg total) by mouth daily. Take for one month and then can increase to 60 mg daily     Psychiatry: Antidepressants - SNRI - duloxetine Passed - 05/19/2022  5:29 PM      Passed - Cr in normal range and within 360 days    Creatinine, Ser  Date Value Ref Range Status  12/23/2021 1.07 0.76 - 1.27 mg/dL Final         Passed - eGFR is 30 or above and within 360 days    GFR, Estimated  Date Value Ref Range Status  12/28/2020 >60 >60 mL/min Final    Comment:    (NOTE) Calculated using the CKD-EPI Creatinine Equation (2021)    eGFR  Date Value Ref Range Status  12/23/2021 79 >59 mL/min/1.73 Final         Passed - Completed PHQ-2 or PHQ-9 in the last 360 days      Passed - Last BP in normal range    BP Readings from Last 1 Encounters:  05/12/22 122/80         Passed - Valid encounter within last 6 months    Recent Outpatient Visits          1 week ago Primary hypertension   [facility] Thedore Mins, [place], PA-C   3 months ago Primary hypertension   [employer], [place], PA-C   4 months ago Encounter for health maintenance examination   [facility] Thedore Mins, [place], [place]

## 2022-05-20 NOTE — Telephone Encounter (Signed)
Requested Prescriptions  Pending Prescriptions Disp Refills  . DULoxetine (CYMBALTA) 30 MG capsule 180 capsule 1    Sig: Take 2 capsules (60 mg total) by mouth daily. Take for one month and then can increase to 60 mg daily     Psychiatry: Antidepressants - SNRI - duloxetine Passed - 05/19/2022  5:29 PM      Passed - Cr in normal range and within 360 days    Creatinine, Ser  Date Value Ref Range Status  12/23/2021 1.07 0.76 - 1.27 mg/dL Final         Passed - eGFR is 30 or above and within 360 days    GFR, Estimated  Date Value Ref Range Status  12/28/2020 >60 >60 mL/min Final    Comment:    (NOTE) Calculated using the CKD-EPI Creatinine Equation (2021)    eGFR  Date Value Ref Range Status  12/23/2021 79 >59 mL/min/1.73 Final         Passed - Completed PHQ-2 or PHQ-9 in the last 360 days      Passed - Last BP in normal range    BP Readings from Last 1 Encounters:  05/12/22 122/80         Passed - Valid encounter within last 6 months    Recent Outpatient Visits          1 week ago Primary hypertension   [facility] Thedore Mins, [place], PA-C   3 months ago Primary hypertension   [employer], [place], PA-C   4 months ago Encounter for health maintenance examination   [facility] Thedore Mins, [place], [place]

## 2022-06-14 ENCOUNTER — Other Ambulatory Visit: Payer: Self-pay | Admitting: Physician Assistant

## 2022-06-14 DIAGNOSIS — R351 Nocturia: Secondary | ICD-10-CM

## 2022-07-28 ENCOUNTER — Telehealth: Payer: Self-pay | Admitting: Physician Assistant

## 2022-07-28 NOTE — Telephone Encounter (Signed)
LOV: 05/12/22 NOV: none LR:04/24/22023 qty:90 R:1

## 2022-07-28 NOTE — Telephone Encounter (Signed)
Pharmacy faxed request for refill of the following:   meloxicam (MOBIC) 15 MG tablet

## 2022-07-29 ENCOUNTER — Other Ambulatory Visit: Payer: Self-pay | Admitting: Physician Assistant

## 2022-07-29 DIAGNOSIS — M129 Arthropathy, unspecified: Secondary | ICD-10-CM

## 2022-07-29 MED ORDER — MELOXICAM 15 MG PO TABS: 15.0000 mg | ORAL_TABLET | Freq: Every day | ORAL | 0 refills | Status: DC

## 2022-07-29 NOTE — Telephone Encounter (Signed)
Refilled with no refills as pt does not have a f/u scheduled

## 2023-01-01 ENCOUNTER — Ambulatory Visit: Payer: BC Managed Care – PPO | Admitting: Physician Assistant

## 2023-01-01 ENCOUNTER — Encounter: Payer: Self-pay | Admitting: Physician Assistant

## 2023-01-01 VITALS — BP 151/93 | HR 69 | Wt 241.2 lb

## 2023-01-01 DIAGNOSIS — Z125 Encounter for screening for malignant neoplasm of prostate: Secondary | ICD-10-CM | POA: Diagnosis not present

## 2023-01-01 DIAGNOSIS — I10 Essential (primary) hypertension: Secondary | ICD-10-CM | POA: Diagnosis not present

## 2023-01-01 DIAGNOSIS — Z0000 Encounter for general adult medical examination without abnormal findings: Secondary | ICD-10-CM | POA: Diagnosis not present

## 2023-01-01 DIAGNOSIS — R5383 Other fatigue: Secondary | ICD-10-CM

## 2023-01-01 DIAGNOSIS — M129 Arthropathy, unspecified: Secondary | ICD-10-CM

## 2023-01-01 DIAGNOSIS — F339 Major depressive disorder, recurrent, unspecified: Secondary | ICD-10-CM

## 2023-01-01 MED ORDER — MELOXICAM 15 MG PO TABS: 15.0000 mg | ORAL_TABLET | Freq: Every day | ORAL | 1 refills | Status: DC

## 2023-01-01 MED ORDER — LISINOPRIL 20 MG PO TABS: 20.0000 mg | ORAL_TABLET | Freq: Every day | ORAL | 3 refills | Status: DC

## 2023-01-01 MED ORDER — DULOXETINE HCL 60 MG PO CPEP: 60.0000 mg | ORAL_CAPSULE | Freq: Every day | ORAL | 3 refills | Status: DC

## 2023-01-01 NOTE — Assessment & Plan Note (Signed)
Managed with cymbalta 60 mg and mobic 15 mg. Will check cmp

## 2023-01-01 NOTE — Assessment & Plan Note (Addendum)
Pt manages with cymbalta 60 mg . Would benefit from therapy, additional depression treatment.  Pt is resistant to discussing further, but does endorse ongoing depression symptoms.

## 2023-01-01 NOTE — Assessment & Plan Note (Addendum)
Pt does not check his blood pressure at home  ~20 pound weight gain Increase lisinopril 20 mg  F/u 4-6 weeks

## 2023-01-01 NOTE — Progress Notes (Signed)
Complete physical exam   Patient: Fernando Arias   DOB: [birth year]   62 y.o. Male  MRN: [id number] Visit Date: 01/01/2023  Today's healthcare provider: Mikey Kirschner, PA-C   Cc. cpe  Subjective    Fernando Arias is a 62 y.o. male who presents today for a complete physical exam.   Fatigued during the day -pt contributes to depression -reports sleeping well -wants to check his testosterone     01/01/2023    9:12 AM 02/03/2022    8:13 AM 12/23/2021    9:44 AM  PHQ9 SCORE ONLY  PHQ-9 Total Score 12 21 19     HPI Past Medical History:  Diagnosis Date   Allergy    Anemia    Anxiety    Arthritis    Depression    GERD (gastroesophageal reflux disease)    Insomnia    Slipped disc in neck    Past Surgical History:  Procedure Laterality Date   VASECTOMY     Social History   Socioeconomic History   Marital status: Married    Spouse name: Not on file   Number of children: Not on file   Years of education: Not on file   Highest education level: Not on file  Occupational History   Not on file  Tobacco Use   Smoking status: Never   Smokeless tobacco: Never  Substance and Sexual Activity   Alcohol use: Never   Drug use: Never   Sexual activity: Not on file  Other Topics Concern   Not on file  Social History Narrative   Not on file   Social Determinants of Health   Financial Resource Strain: Not on file  Food Insecurity: Not on file  Transportation Needs: Not on file  Physical Activity: Not on file  Stress: Not on file  Social Connections: Not on file  Intimate Partner Violence: Not on file   Family Status  Relation Name Status   Mother  Deceased   Sister  Alive   Sister  Alive   Brother  Alive   MGM  Deceased   MGF  Deceased   Family History  Problem Relation Age of Onset   Diabetes Mother    GER disease Sister    GER disease Sister    GER disease Brother    Diabetes Maternal Grandmother    Cancer - Lung Maternal Grandfather    No Known  Allergies  Patient Care Team: Mikey Kirschner, PA-C as PCP - General (Physician Assistant) Grayland Ormond, Kathlene November, MD as Consulting Physician (Hematology and Oncology)   Medications: Outpatient Medications Prior to Visit  Medication Sig   Aspirin-Salicylamide-Caffeine (BC HEADACHE POWDER PO) Take 1 packet by mouth daily as needed.   calcium carbonate (OS-CAL) 1250 (500 Ca) MG chewable tablet Chew 1 tablet by mouth daily.   cholecalciferol (VITAMIN D3) 25 MCG (1000 UNIT) tablet Take 1,000 Units by mouth daily.   Multiple Vitamins-Minerals (CENTRUM SILVER 50+MEN) TABS Take 1 tablet by mouth daily.   [DISCONTINUED] azelastine (ASTELIN) 0.1 % nasal spray Place 1 spray into both nostrils 2 (two) times daily. Use in each nostril as directed   [DISCONTINUED] DULoxetine (CYMBALTA) 30 MG capsule Take 2 capsules (60 mg total) by mouth daily. Take for one month and then can increase to 60 mg daily   [DISCONTINUED] hydrOXYzine (VISTARIL) 50 MG capsule TAKE 1 CAPSULE BY MOUTH AT BEDTIME.   [DISCONTINUED] lisinopril (ZESTRIL) 10 MG tablet Take 1 tablet (10 mg total) by mouth daily.   [  DISCONTINUED] meloxicam (MOBIC) 15 MG tablet Take 1 tablet (15 mg total) by mouth daily.   [DISCONTINUED] tamsulosin (FLOMAX) 0.4 MG CAPS capsule TAKE 1 CAPSULE BY MOUTH EVERY DAY   No facility-administered medications prior to visit.    Review of Systems  Constitutional:  Positive for fatigue. Negative for fever.  Respiratory:  Negative for cough and shortness of breath.   Cardiovascular:  Negative for chest pain, palpitations and leg swelling.  Genitourinary:  Positive for frequency.  Neurological:  Negative for dizziness and headaches.     Objective    BP (!) 151/93 (BP Location: Left Arm, Patient Position: Sitting, Cuff Size: Normal)   Pulse 69   Wt 241 lb 3.2 oz (109.4 kg)   SpO2 99%   BMI 30.97 kg/m    Physical Exam Constitutional:      General: He is awake.     Appearance: He is well-developed.  HENT:      Head: Normocephalic.     Right Ear: Tympanic membrane, ear canal and external ear normal.     Left Ear: Tympanic membrane, ear canal and external ear normal.     Nose: Nose normal. No congestion or rhinorrhea.     Mouth/Throat:     Mouth: Mucous membranes are moist.     Pharynx: No oropharyngeal exudate or posterior oropharyngeal erythema.  Eyes:     Pupils: Pupils are equal, round, and reactive to light.  Cardiovascular:     Rate and Rhythm: Normal rate and regular rhythm.     Heart sounds: Normal heart sounds.  Pulmonary:     Effort: Pulmonary effort is normal.     Breath sounds: Normal breath sounds.  Abdominal:     General: There is no distension.     Palpations: Abdomen is soft.     Tenderness: There is no abdominal tenderness. There is no guarding.  Musculoskeletal:     Cervical back: Normal range of motion.     Right lower leg: No edema.     Left lower leg: No edema.  Lymphadenopathy:     Cervical: No cervical adenopathy.  Skin:    General: Skin is warm.  Neurological:     Mental Status: He is alert and oriented to person, place, and time.  Psychiatric:        Attention and Perception: Attention normal.        Mood and Affect: Mood normal.        Speech: Speech normal.        Behavior: Behavior normal. Behavior is cooperative.     Last depression screening scores    01/01/2023    9:12 AM 02/03/2022    8:13 AM 12/23/2021    9:44 AM  PHQ 2/9 Scores  PHQ - 2 Score 4 6 6   PHQ- 9 Score 12 21 19    Last fall risk screening    02/03/2022    8:14 AM  [place] in the past year? 0  Number falls in past yr: 0  Injury with Fall? 0  Risk for fall due to : No Fall Risks   Last Audit-C alcohol use screening    01/01/2023    9:13 AM  Alcohol Use Disorder Test (AUDIT)  1. How often do you have a drink containing alcohol? 0  2. How many drinks containing alcohol do you have on a typical day when you are drinking? 0  3. How often do you have six or more  drinks on one  occasion? 0  AUDIT-C Score 0   A score of 3 or more in women, and 4 or more in men indicates increased risk for alcohol abuse, EXCEPT if all of the points are from question 1   No results found for any visits on 01/01/23.  Assessment & Plan    Routine Health Maintenance and Physical Exam  Exercise Activities and Dietary recommendations --balanced diet high in fiber and protein, low in sugars, carbs, fats. --physical activity/exercise 30 minutes 3-5 times a week     Immunization History  Administered Date(s) Administered   PFIZER Comirnaty(Gray Top)Covid-19 Tri-Sucrose Vaccine 07/05/2020, 07/27/2020   Td 12/23/2021   Zoster Recombinat (Shingrix) 12/23/2021, 05/12/2022    Health Maintenance  Topic Date Due   COVID-19 Vaccine (3 - Pfizer risk series) 08/24/2020   INFLUENZA VACCINE  Never done   COLONOSCOPY (Pts 45-44yrs Insurance coverage will need to be confirmed)  01/01/2024 (Originally 11/03/2006)   DTaP/Tdap/Td (2 - Tdap) 12/24/2031   Hepatitis C Screening  Completed   HIV Screening  Completed   Zoster Vaccines- Shingrix  Completed   HPV VACCINES  Aged Out    Discussed health benefits of physical activity, and encouraged him to engage in regular exercise appropriate for his age and condition.  Problem List Items Addressed This Visit       Cardiovascular and Mediastinum   Primary hypertension    Pt does not check his blood pressure at home  ~20 pound weight gain Increase lisinopril 20 mg  F/u 4-6 weeks      Relevant Medications   lisinopril (ZESTRIL) 20 MG tablet     Musculoskeletal and Integument   Arthritis, multiple joint involvement    Managed with cymbalta 60 mg and mobic 15 mg. Will check cmp        Relevant Medications   DULoxetine (CYMBALTA) 60 MG capsule   meloxicam (MOBIC) 15 MG tablet     Other   Depression, recurrent (HCC)    Pt manages with cymbalta 60 mg . Would benefit from therapy, additional depression treatment.  Pt is  resistant to discussing further, but does endorse ongoing depression symptoms.       Relevant Medications   DULoxetine (CYMBALTA) 60 MG capsule   Other Visit Diagnoses     Annual physical exam    -  Primary   Relevant Orders   CBC w/Diff/Platelet   Comprehensive Metabolic Panel (CMET)   Lipid Profile   HgB A1c   PSA   Prostate cancer screening       Relevant Orders   PSA   Other fatigue       Relevant Orders   Testosterone,Free and Total       Fatigue -Largely 2/2 uncontrolled depression -will check testosterone per pt. Advised any tx needed would ref to endo/urology  Pt declines colon cancer screening.  Return in about 4 weeks (around 01/29/2023) for hypertension.     I, Mikey Kirschner, PA-C have reviewed all documentation for this visit. The documentation on  01/01/23  for the exam, diagnosis, procedures, and orders are all accurate and complete.  Mikey Kirschner, PA-C [facility] [address] [place], [place], [address] Office: [phone] Fax: [phone]

## 2023-01-03 LAB — TESTOSTERONE,FREE AND TOTAL
Testosterone, Free: 4.7 pg/mL — ABNORMAL LOW (ref 6.6–18.1)
Testosterone: 627 ng/dL (ref 264–916)

## 2023-01-03 LAB — CBC WITH DIFFERENTIAL/PLATELET
Basophils Absolute: 0 10*3/uL (ref 0.0–0.2)
Basos: 1 %
EOS (ABSOLUTE): 0.1 10*3/uL (ref 0.0–0.4)
Eos: 2 %
Hematocrit: 36.6 % — ABNORMAL LOW (ref 37.5–51.0)
Hemoglobin: 11.4 g/dL — ABNORMAL LOW (ref 13.0–17.7)
Immature Grans (Abs): 0 10*3/uL (ref 0.0–0.1)
Immature Granulocytes: 0 %
Lymphocytes Absolute: 1.6 10*3/uL (ref 0.7–3.1)
Lymphs: 23 %
MCH: 20.6 pg — ABNORMAL LOW (ref 26.6–33.0)
MCHC: 31.1 g/dL — ABNORMAL LOW (ref 31.5–35.7)
MCV: 66 fL — ABNORMAL LOW (ref 79–97)
Monocytes Absolute: 0.6 10*3/uL (ref 0.1–0.9)
Monocytes: 9 %
Neutrophils Absolute: 4.6 10*3/uL (ref 1.4–7.0)
Neutrophils: 65 %
Platelets: 225 10*3/uL (ref 150–450)
RBC: 5.53 x10E6/uL (ref 4.14–5.80)
RDW: 16.5 % — ABNORMAL HIGH (ref 11.6–15.4)
WBC: 6.9 10*3/uL (ref 3.4–10.8)

## 2023-01-03 LAB — COMPREHENSIVE METABOLIC PANEL
ALT: 22 IU/L (ref 0–44)
AST: 25 IU/L (ref 0–40)
Albumin/Globulin Ratio: 1.8 (ref 1.2–2.2)
Albumin: 4.4 g/dL (ref 3.9–4.9)
Alkaline Phosphatase: 98 IU/L (ref 44–121)
BUN/Creatinine Ratio: 17 (ref 10–24)
BUN: 21 mg/dL (ref 8–27)
Bilirubin Total: 1.4 mg/dL — ABNORMAL HIGH (ref 0.0–1.2)
CO2: 23 mmol/L (ref 20–29)
Calcium: 9.9 mg/dL (ref 8.6–10.2)
Chloride: 104 mmol/L (ref 96–106)
Creatinine, Ser: 1.22 mg/dL (ref 0.76–1.27)
Globulin, Total: 2.4 g/dL (ref 1.5–4.5)
Glucose: 95 mg/dL (ref 70–99)
Potassium: 5.2 mmol/L (ref 3.5–5.2)
Sodium: 142 mmol/L (ref 134–144)
Total Protein: 6.8 g/dL (ref 6.0–8.5)
eGFR: 67 mL/min/{1.73_m2} (ref >59)

## 2023-01-03 LAB — LIPID PANEL
Chol/HDL Ratio: 2.7 ratio (ref 0.0–5.0)
Cholesterol, Total: 151 mg/dL (ref 100–199)
HDL: 56 mg/dL (ref >39)
LDL Chol Calc (NIH): 81 mg/dL (ref 0–99)
Triglycerides: 71 mg/dL (ref 0–149)
VLDL Cholesterol Cal: 14 mg/dL (ref 5–40)

## 2023-01-03 LAB — PSA: Prostate Specific Ag, Serum: 1.1 ng/mL (ref 0.0–4.0)

## 2023-01-03 LAB — HEMOGLOBIN A1C
Est. average glucose Bld gHb Est-mCnc: 100 mg/dL
Hgb A1c MFr Bld: 5.1 % (ref 4.8–5.6)

## 2023-02-04 NOTE — Progress Notes (Unsigned)
     I,Sha'taria Tyson,acting as a Neurosurgeon for [employer], PA-C.,have documented all relevant documentation on the behalf of Alfredia Ferguson, PA-C,as directed by  Alfredia Ferguson, PA-C while in the presence of Alfredia Ferguson, PA-C.   Established patient visit   Patient: Fernando Arias   DOB: August 01, 1961   63 y.o. Male  MRN: [id number] Visit Date: 02/05/2023  Today's healthcare provider: Alfredia Ferguson, PA-C   No chief complaint on file.  Subjective    HPI  Hypertension, follow-up  BP Readings from Last 3 Encounters:  01/01/23 (!) 151/93  05/12/22 122/80  02/03/22 (!) 145/86   Wt Readings from Last 3 Encounters:  01/01/23 241 lb 3.2 oz (109.4 kg)  05/12/22 220 lb 12.8 oz (100.2 kg)  02/03/22 221 lb 1.6 oz (100.3 kg)     He was last seen for hypertension 5 weeks ago.  BP at that visit was 151/93. Management since that visit includes increase lisinopril 20 mg .  He reports {excellent/good/fair/poor:19665} compliance with treatment. He {is/is not:9024} having side effects. {document side effects if present:1} He is following a {diet:21022986} diet. He {is/is not:9024} exercising. He {does/does not:200015} smoke.  Use of agents associated with hypertension: {bp agents assoc with hypertension:511::"none"}.   Outside blood pressures are {***enter patient reported home BP readings, or 'not being checked':1}. Symptoms: {Yes/No:20286} chest pain {Yes/No:20286} chest pressure  {Yes/No:20286} palpitations {Yes/No:20286} syncope  {Yes/No:20286} dyspnea {Yes/No:20286} orthopnea  {Yes/No:20286} paroxysmal nocturnal dyspnea {Yes/No:20286} lower extremity edema   Pertinent labs Lab Results  Component Value Date   CHOL 151 01/01/2023   HDL 56 01/01/2023   LDLCALC 81 01/01/2023   TRIG 71 01/01/2023   CHOLHDL 2.7 01/01/2023   Lab Results  Component Value Date   NA 142 01/01/2023   K 5.2 01/01/2023   CREATININE 1.22 01/01/2023   EGFR 67 01/01/2023   GLUCOSE 95 01/01/2023   TSH  1.140 12/23/2021     The 10-year ASCVD risk score (Arnett DK, et al., 2019) is: 10.5% ---------------------------------------------------------------------------------------------------   Medications: Outpatient Medications Prior to Visit  Medication Sig   Aspirin-Salicylamide-Caffeine (BC HEADACHE POWDER PO) Take 1 packet by mouth daily as needed.   calcium carbonate (OS-CAL) 1250 (500 Ca) MG chewable tablet Chew 1 tablet by mouth daily.   cholecalciferol (VITAMIN D3) 25 MCG (1000 UNIT) tablet Take 1,000 Units by mouth daily.   DULoxetine (CYMBALTA) 60 MG capsule Take 1 capsule (60 mg total) by mouth daily.   lisinopril (ZESTRIL) 20 MG tablet Take 1 tablet (20 mg total) by mouth daily.   meloxicam (MOBIC) 15 MG tablet Take 1 tablet (15 mg total) by mouth daily.   Multiple Vitamins-Minerals (CENTRUM SILVER 50+MEN) TABS Take 1 tablet by mouth daily.   No facility-administered medications prior to visit.    Review of Systems  {Labs  Heme  Chem  Endocrine  Serology  Results Review (optional):23779}   Objective    There were no vitals taken for this visit. {Show previous vital signs (optional):23777}  Physical Exam  ***  No results found for any visits on 02/05/23.  Assessment & Plan     ***  No follow-ups on file.      {provider attestation***:1}   Alfredia Ferguson, PA-C  [facility] [phone] (phone) [phone] (fax)  [facility]

## 2023-02-05 ENCOUNTER — Ambulatory Visit: Payer: BC Managed Care – PPO | Admitting: Physician Assistant

## 2023-02-05 ENCOUNTER — Other Ambulatory Visit: Payer: Self-pay

## 2023-02-05 ENCOUNTER — Encounter: Payer: Self-pay | Admitting: Physician Assistant

## 2023-02-05 ENCOUNTER — Telehealth: Payer: Self-pay | Admitting: Physician Assistant

## 2023-02-05 VITALS — BP 143/87 | HR 65 | Wt 250.3 lb

## 2023-02-05 DIAGNOSIS — F339 Major depressive disorder, recurrent, unspecified: Secondary | ICD-10-CM

## 2023-02-05 DIAGNOSIS — M129 Arthropathy, unspecified: Secondary | ICD-10-CM

## 2023-02-05 DIAGNOSIS — I10 Essential (primary) hypertension: Secondary | ICD-10-CM | POA: Diagnosis not present

## 2023-02-05 MED ORDER — LISINOPRIL 40 MG PO TABS: 40.0000 mg | ORAL_TABLET | Freq: Every day | ORAL | 3 refills | Status: DC

## 2023-02-05 NOTE — Telephone Encounter (Signed)
[facility] requesting prescription refill DULoxetine (CYMBALTA) 60 MG capsule   Please advise

## 2023-02-05 NOTE — Assessment & Plan Note (Signed)
Still not checking BP at home  BP in office still elevated Increase lisinopril to 40 mg F/u 6 weeks

## 2023-02-09 ENCOUNTER — Ambulatory Visit: Payer: Self-pay

## 2023-02-09 NOTE — Telephone Encounter (Signed)
Chief Complaint: Clarification on lisinopril dosage   Disposition: [] ED /[] Urgent Care (no appt availability in office) / [] Appointment(In office/virtual)/ []  [facility]/ [x] Home Care/ [] Refused Recommended Disposition /[] [facility]/ []  Follow-up with PCP Additional Notes: patient calling asking how much lisinopril does he take, advised 40 mg daily. He verbalized understanding and says that's what he picked up from the pharmacy.   Summary: how to take linsinopril   Patient called in for clarification of how to take lisinopril med, states he was told by Dr Ok Edwards  to double up on 20mg  med, which would be in total 40 mg but he received his refill today that shows 90 tab of 40 mg. He is thinking he is supposed to take 2 of these 90 days.         Reason for Disposition  Caller has medicine question only, adult not sick, AND triager answers question  Answer Assessment - Initial Assessment Questions 1. NAME of MEDICINE: "What medicine(s) are you calling about?"     Lisinopril 2. QUESTION: "What is your question?" (e.g., double dose of medicine, side effect)     Am I taking 40 mg 3. PRESCRIBER: "Who prescribed the medicine?" Reason: if prescribed by specialist, call should be referred to that group.     Alfredia Ferguson  Protocols used: Medication Question Call-A-AH

## 2023-03-25 NOTE — Progress Notes (Signed)
I,Vanessa  Vital,acting as a Neurosurgeon for [employer], PA-C.,have documented all relevant documentation on the behalf of Alfredia Ferguson, PA-C,as directed by  Alfredia Ferguson, PA-C while in the presence of Alfredia Ferguson, PA-C.    Established patient visit   Patient: Fernando Arias   DOB: 1961-09-15   62 y.o. Male  MRN: [id number] Visit Date: 03/26/2023  Today's healthcare provider: Alfredia Ferguson, PA-C   Cc. Htn f/u  Subjective    HPI HPI   Patient would like to speak about low testerone Blood pressure follow up Last edited by Daneen Schick, CMA on 03/26/2023 11:08 AM.      Hypertension, follow-up  BP Readings from Last 3 Encounters:  03/26/23 (!) 131/90  02/05/23 (!) 143/87  01/01/23 (!) 151/93   Wt Readings from Last 3 Encounters:  03/26/23 254 lb (115.2 kg)  02/05/23 250 lb 4.8 oz (113.5 kg)  01/01/23 241 lb 3.2 oz (109.4 kg)     Last visit increased lisinopril to 40 mg  Pertinent labs Lab Results  Component Value Date   CHOL 151 01/01/2023   HDL 56 01/01/2023   LDLCALC 81 01/01/2023   TRIG 71 01/01/2023   CHOLHDL 2.7 01/01/2023   Lab Results  Component Value Date   NA 142 01/01/2023   K 5.2 01/01/2023   CREATININE 1.22 01/01/2023   EGFR 67 01/01/2023   GLUCOSE 95 01/01/2023   TSH 1.140 12/23/2021     The 10-year ASCVD risk score (Arnett DK, et al., 2019) is: 8.2%  ---------------------------------------------------------------------------------------------------  Medications: Outpatient Medications Prior to Visit  Medication Sig   Aspirin-Salicylamide-Caffeine (BC HEADACHE POWDER PO) Take 1 packet by mouth daily as needed.   calcium carbonate (OS-CAL) 1250 (500 Ca) MG chewable tablet Chew 1 tablet by mouth daily.   DULoxetine (CYMBALTA) 60 MG capsule Take 1 capsule (60 mg total) by mouth daily.   lisinopril (ZESTRIL) 40 MG tablet Take 1 tablet (40 mg total) by mouth daily.   meloxicam (MOBIC) 15 MG tablet Take 1 tablet (15 mg total) by mouth  daily.   Multiple Vitamins-Minerals (CENTRUM SILVER 50+MEN) TABS Take 1 tablet by mouth daily.   [DISCONTINUED] cholecalciferol (VITAMIN D3) 25 MCG (1000 UNIT) tablet Take 1,000 Units by mouth daily.   No facility-administered medications prior to visit.   Review of Systems  Constitutional:  Negative for fatigue and fever.  Respiratory:  Negative for cough and shortness of breath.   Cardiovascular:  Negative for chest pain, palpitations and leg swelling.  Neurological:  Negative for dizziness and headaches.     Objective    BP (!) 131/90 (BP Location: Right Arm, Patient Position: Sitting, Cuff Size: Normal)   Pulse 74   Temp (!) 97.5 F (36.4 C) (Oral)   Ht 6\' 2"  (1.88 m)   Wt 254 lb (115.2 kg)   SpO2 98%   BMI 32.61 kg/m   Physical Exam Vitals reviewed.  Constitutional:      Appearance: He is not ill-appearing.  HENT:     Head: Normocephalic.  Eyes:     Conjunctiva/sclera: Conjunctivae normal.  Cardiovascular:     Rate and Rhythm: Normal rate.  Pulmonary:     Effort: Pulmonary effort is normal. No respiratory distress.  Neurological:     General: No focal deficit present.     Mental Status: He is alert and oriented to person, place, and time.  Psychiatric:        Mood and Affect: Mood normal.  Behavior: Behavior normal.      No results found for any visits on 03/26/23.  Assessment & Plan      Problem List Items Addressed This Visit       Cardiovascular and Mediastinum   Primary hypertension - Primary    Not checking bp at home  Elevated in office but improved Cont lisinopril 40 mg add hctz 12.5 mg  F/u 4 weeks      Relevant Medications   hydrochlorothiazide (MICROZIDE) 12.5 MG capsule   Other Visit Diagnoses     Low testosterone       Relevant Orders   Ambulatory referral to Urology       Return in about 4 weeks (around 04/23/2023) for hypertension.      I, Alfredia Ferguson, PA-C have reviewed all documentation for this visit. The  documentation on  03/26/23   for the exam, diagnosis, procedures, and orders are all accurate and complete.  Alfredia Ferguson, PA-C [facility] [address] [place], [place], [address] Office: [phone] Fax: [phone]   [facility]

## 2023-03-26 ENCOUNTER — Ambulatory Visit: Payer: BC Managed Care – PPO | Admitting: Physician Assistant

## 2023-03-26 ENCOUNTER — Encounter: Payer: Self-pay | Admitting: Physician Assistant

## 2023-03-26 VITALS — BP 131/90 | HR 74 | Temp 97.5°F | Ht 74.0 in | Wt 254.0 lb

## 2023-03-26 DIAGNOSIS — R7989 Other specified abnormal findings of blood chemistry: Secondary | ICD-10-CM | POA: Diagnosis not present

## 2023-03-26 DIAGNOSIS — I10 Essential (primary) hypertension: Secondary | ICD-10-CM | POA: Diagnosis not present

## 2023-03-26 MED ORDER — HYDROCHLOROTHIAZIDE 12.5 MG PO CAPS: 12.5000 mg | ORAL_CAPSULE | Freq: Every day | ORAL | 1 refills | Status: DC

## 2023-03-26 NOTE — Assessment & Plan Note (Signed)
Not checking bp at home  Elevated in office but improved Cont lisinopril 40 mg add hctz 12.5 mg  F/u 4 weeks

## 2023-04-21 ENCOUNTER — Ambulatory Visit (INDEPENDENT_AMBULATORY_CARE_PROVIDER_SITE_OTHER): Payer: BC Managed Care – PPO | Admitting: Physician Assistant

## 2023-04-21 ENCOUNTER — Encounter: Payer: Self-pay | Admitting: Physician Assistant

## 2023-04-21 VITALS — BP 114/68 | HR 87 | Ht 74.0 in | Wt 246.7 lb

## 2023-04-21 DIAGNOSIS — I10 Essential (primary) hypertension: Secondary | ICD-10-CM | POA: Diagnosis not present

## 2023-04-21 NOTE — Progress Notes (Signed)
Established patient visit   Patient: Fernando Arias   DOB: [birth year]   62 y.o. Male  MRN: [id number] Visit Date: 04/21/2023  Today's healthcare provider: Alfredia Ferguson, PA-C   Chief Complaint  Patient presents with   Medical Management of Chronic Issues    Patient is present for htn f/u. He was last seen on 03/26/23 and advised to continue lisinopril 40 mg and add hydrochlorothiazide 12.5 mg. Patient reports taking medications as prescribed with no s/e. Patient reports he is not checking blood pressure. States no symptoms to report.   Subjective    HPI Hypertension, follow-up  BP Readings from Last 3 Encounters:  04/21/23 114/68  03/26/23 (!) 131/90  02/05/23 (!) 143/87   Wt Readings from Last 3 Encounters:  04/21/23 246 lb 11.2 oz (111.9 kg)  03/26/23 254 lb (115.2 kg)  02/05/23 250 lb 4.8 oz (113.5 kg)     Pertinent labs Lab Results  Component Value Date   CHOL 151 01/01/2023   HDL 56 01/01/2023   LDLCALC 81 01/01/2023   TRIG 71 01/01/2023   CHOLHDL 2.7 01/01/2023   Lab Results  Component Value Date   NA 142 01/01/2023   K 5.2 01/01/2023   CREATININE 1.22 01/01/2023   EGFR 67 01/01/2023   GLUCOSE 95 01/01/2023   TSH 1.140 12/23/2021     The 10-year ASCVD risk score (Arnett DK, et al., 2019) is: 6.5%  ---------------------------------------------------------------------------------------------------   Medications: Outpatient Medications Prior to Visit  Medication Sig   Aspirin-Salicylamide-Caffeine (BC HEADACHE POWDER PO) Take 1 packet by mouth daily as needed.   calcium carbonate (OS-CAL) 1250 (500 Ca) MG chewable tablet Chew 1 tablet by mouth daily.   DULoxetine (CYMBALTA) 60 MG capsule Take 1 capsule (60 mg total) by mouth daily.   hydrochlorothiazide (MICROZIDE) 12.5 MG capsule Take 1 capsule (12.5 mg total) by mouth daily.   lisinopril (ZESTRIL) 40 MG tablet Take 1 tablet (40 mg total) by mouth daily.   meloxicam (MOBIC) 15 MG tablet Take 1  tablet (15 mg total) by mouth daily.   Multiple Vitamins-Minerals (CENTRUM SILVER 50+MEN) TABS Take 1 tablet by mouth daily.   No facility-administered medications prior to visit.   Review of Systems  Constitutional:  Negative for fatigue and fever.  Respiratory:  Negative for cough and shortness of breath.   Cardiovascular:  Negative for chest pain, palpitations and leg swelling.  Neurological:  Negative for dizziness and headaches.     Objective    BP 114/68 (BP Location: Left Arm, Patient Position: Sitting, Cuff Size: Normal)   Pulse 87   Ht 6\' 2"  (1.88 m)   Wt 246 lb 11.2 oz (111.9 kg)   SpO2 100%   BMI 31.67 kg/m   Physical Exam Vitals reviewed.  Constitutional:      Appearance: He is not ill-appearing.  HENT:     Head: Normocephalic.  Eyes:     Conjunctiva/sclera: Conjunctivae normal.  Cardiovascular:     Rate and Rhythm: Normal rate.  Pulmonary:     Effort: Pulmonary effort is normal. No respiratory distress.  Neurological:     General: No focal deficit present.     Mental Status: He is alert and oriented to person, place, and time.  Psychiatric:        Mood and Affect: Mood normal.        Behavior: Behavior normal.      No results found for any visits on 04/21/23.  Assessment & Plan  Problem List Items Addressed This Visit       Cardiovascular and Mediastinum   Primary hypertension - Primary    Improved in office Cont lisinopril 40 mg and hydrochlorothiazide 12.5 mg  F/u 3mo       Return in about 6 months (around 10/22/2023) for CPE.     I, Alfredia Ferguson, PA-C have reviewed all documentation for this visit. The documentation on  04/21/23   for the exam, diagnosis, procedures, and orders are all accurate and complete.  Alfredia Ferguson, PA-C [facility] [address] [place], [place], [address] Office: [phone] Fax: [phone]   [facility]

## 2023-04-21 NOTE — Assessment & Plan Note (Signed)
Improved in office Cont lisinopril 40 mg and hydrochlorothiazide 12.5 mg  F/u 66mo

## 2023-04-23 ENCOUNTER — Ambulatory Visit: Payer: BC Managed Care – PPO | Admitting: Urology

## 2023-04-23 ENCOUNTER — Encounter: Payer: Self-pay | Admitting: Urology

## 2023-04-23 VITALS — BP 115/73 | HR 76 | Ht 74.0 in | Wt 247.0 lb

## 2023-04-23 DIAGNOSIS — R5383 Other fatigue: Secondary | ICD-10-CM | POA: Diagnosis not present

## 2023-04-23 DIAGNOSIS — R7989 Other specified abnormal findings of blood chemistry: Secondary | ICD-10-CM

## 2023-04-23 NOTE — Progress Notes (Signed)
I, Fernando Arias,acting as a scribe for Fernando Altes, MD.,have documented all relevant documentation on the behalf of Fernando Altes, MD,as directed by  Fernando Altes, MD while in the presence of Fernando Altes, MD.  04/23/2023 10:03 AM   Fernando Arias [birth year] [id number]  Referring provider: Alfredia Ferguson, PA-C [address] [place],  [place] [address]  Chief Complaint  Patient presents with   New Patient (Initial Visit)    HPI: Fernando Arias is a 62 y.o. male referred for evaluation of low testosterone.   A total testosterone level drawn 01/01/23 was normal at 627 ng/dL; a free testosterone was checked, which was slightly low at 4.7 pg/mL Long history (>5 years) of low energy, tiredness, and fatigue.  Does have a history of snoring, but no sleep apnea testing. Prior history of stone disease No ED   PMH: Past Medical History:  Diagnosis Date   Allergy    Anemia    Anxiety    Arthritis    Depression    GERD (gastroesophageal reflux disease)    Insomnia    Slipped disc in neck     Surgical History: Past Surgical History:  Procedure Laterality Date   VASECTOMY      Home Medications:  Allergies as of 04/23/2023   No Known Allergies      Medication List        Accurate as of April 23, 2023 10:03 AM. If you have any questions, ask your nurse or doctor.          BC HEADACHE POWDER PO Take 1 packet by mouth daily as needed.   calcium carbonate 1250 (500 Ca) MG chewable tablet Commonly known as: OS-CAL Chew 1 tablet by mouth daily.   Centrum Silver 50+Men Tabs Take 1 tablet by mouth daily.   DULoxetine 60 MG capsule Commonly known as: CYMBALTA Take 1 capsule (60 mg total) by mouth daily.   hydrochlorothiazide 12.5 MG capsule Commonly known as: MICROZIDE Take 1 capsule (12.5 mg total) by mouth daily.   lisinopril 40 MG tablet Commonly known as: ZESTRIL Take 1 tablet (40 mg total) by mouth daily.   meloxicam 15 MG  tablet Commonly known as: MOBIC Take 1 tablet (15 mg total) by mouth daily.        Allergies: No Known Allergies  Family History: Family History  Problem Relation Age of Onset   Diabetes Mother    GER disease Sister    GER disease Sister    GER disease Brother    Diabetes Maternal Grandmother    Cancer - Lung Maternal Grandfather     Social History:  reports that he has never smoked. He has never used smokeless tobacco. He reports that he does not drink alcohol and does not use drugs.   Physical Exam: BP 115/73   Pulse 76   Ht 6\' 2"  (1.88 m)   Wt 247 lb (112 kg)   BMI 31.71 kg/m   Constitutional:  Alert and oriented, No acute distress. HEENT:  AT Respiratory: Normal respiratory effort, no increased work of breathing. GU: Phallus without lesions. Testes descended bilaterally with estimated volume 20+ cc bilaterally. Psychiatric: Normal mood and affect.   Assessment & Plan:    1. Fatigue His total testosterone level was normal and slightly low free testosterone We discussed that AUA guidelines for management of testosterone deficiency are based on total testosterone levels Recheck total testosterone and LH.  I have reviewed the above documentation for  accuracy and completeness, and I agree with the above.   Fernando Altes, MD  [facility] [address] [place], [place] [address] [phone]

## 2023-04-24 LAB — TESTOSTERONE, BIOAVAILABLE (M)

## 2023-04-24 LAB — LUTEINIZING HORMONE: LH: 9.1 m[IU]/mL — ABNORMAL HIGH (ref 1.7–8.6)

## 2023-04-25 LAB — TESTOSTERONE,FREE AND TOTAL: Testosterone, Free: 6.3 pg/mL — ABNORMAL LOW (ref 6.6–18.1)

## 2023-04-25 LAB — TESTOSTERONE, BIOAVAILABLE (M): Testosterone: 453 ng/dL (ref 264–916)

## 2023-06-25 ENCOUNTER — Encounter: Payer: Self-pay | Admitting: Physician Assistant

## 2023-06-25 ENCOUNTER — Ambulatory Visit (INDEPENDENT_AMBULATORY_CARE_PROVIDER_SITE_OTHER): Payer: BC Managed Care – PPO | Admitting: Physician Assistant

## 2023-06-25 VITALS — BP 124/77 | HR 96 | Temp 98.2°F | Ht 74.0 in | Wt 250.4 lb

## 2023-06-25 DIAGNOSIS — G9331 Postviral fatigue syndrome: Secondary | ICD-10-CM

## 2023-06-25 NOTE — Progress Notes (Signed)
Established patient visit  Patient: Fernando Arias   DOB: [birth year]   62 y.o. Male  MRN: [id number] Visit Date: 06/25/2023  Today's healthcare provider: Debera Lat, PA-C   Chief Complaint  Patient presents with   URI    Associated symptoms inlclude achiness, headache, jiont pain , rhinorrhea and sore throat (Sinus drainage).  Recent episode started 1 to 4 weeks ago (First of the month).  The problem has been gradually worsening since onset.  The temperature has been with in normal range.  Patient  is drinking plenty of fluids.  Past hisotry is significant for  occational episodes of bronchitis and pneumonia.  Patient is not a smoker. Additional comments: Childhood bronchitisildhood bronchitis   Subjective     Discussed the use of AI scribe software for clinical note transcription with the patient, who gave verbal consent to proceed.  History of Present Illness   The patient, with a history of hypertension managed with Lisinopril, presents with nasal congestion, postnasal drip, and a cough. The patient attributes the cough to his Lisinopril medication. He also reports tinnitus, which is a constant symptom. The patient recently completed a course of Amoxicillin for Fernando unspecified condition. Since the completion of the antibiotic course, he reports feeling sore and having a scratchy throat, especially at night. 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 He denies smoking and having any known allergies.           06/25/2023    2:55 PM 03/26/2023   11:14 AM 02/05/2023   11:12 AM  Depression screen PHQ 2/9  Decreased Interest 1 1 1   Down, Depressed, Hopeless 1 2 1   PHQ - 2 Score 2 3 2   Altered sleeping 1 2 1   Tired, decreased energy 3 3 3   Change in appetite 1 0 0  Feeling bad or failure about yourself  0 2 2  Trouble concentrating 0 0 1  Moving slowly or fidgety/restless 0 0 0  Suicidal thoughts 0 0 0  PHQ-9 Score 7 10 9   Difficult  doing work/chores Not difficult at all Somewhat difficult Somewhat difficult      06/25/2023    2:55 PM 12/23/2021    9:44 AM  GAD 7 : Generalized Anxiety Score  Nervous, Anxious, on Edge 1 1  Control/stop worrying 1 1  Worry too much - different things 1 2  Trouble relaxing 0 0  Restless 0 0  Easily annoyed or irritable  2  Afraid - awful might happen 1 1  Total GAD 7 Score  7  Anxiety Difficulty Somewhat difficult Somewhat difficult    Medications: Outpatient Medications Prior to Visit  Medication Sig   Aspirin-Salicylamide-Caffeine (BC HEADACHE POWDER PO) Take 1 packet by mouth daily as needed.   calcium carbonate (OS-CAL) 1250 (500 Ca) MG chewable tablet Chew 1 tablet by mouth daily.   DULoxetine (CYMBALTA) 60 MG capsule Take 1 capsule (60 mg total) by mouth daily.   hydrochlorothiazide (MICROZIDE) 12.5 MG capsule Take 1 capsule (12.5 mg total) by mouth daily.   lisinopril (ZESTRIL) 40 MG tablet Take 1 tablet (40 mg total) by mouth daily.   meloxicam (MOBIC) 15 MG tablet Take 1 tablet (15 mg total) by mouth daily.   Multiple Vitamins-Minerals (CENTRUM SILVER 50+MEN) TABS Take 1 tablet by mouth daily.   No facility-administered medications prior to visit.    Review of Systems  All other systems reviewed and are negative.  Except see HPI  Objective    BP 124/77 (BP Location: Right Arm, Patient Position: Sitting, Cuff Size: Normal)   Pulse 96   Temp 98.2 F (36.8 C) (Oral)   Ht 6\' 2"  (1.88 m)   Wt 250 lb 6.4 oz (113.6 kg)   SpO2 97%   BMI 32.15 kg/m     Physical Exam Vitals reviewed.  Constitutional:      General: He is in acute distress.     Appearance: Normal appearance. He is not ill-appearing, toxic-appearing or diaphoretic.  HENT:     Head: Normocephalic and atraumatic.     Right Ear: Tympanic membrane, ear canal and external ear normal.     Left Ear: Ear canal and external ear normal. There is impacted cerumen (partially).     Nose: Congestion  and rhinorrhea present.     Mouth/Throat:     Pharynx: No posterior oropharyngeal erythema.     Comments: Postnasal drainage Eyes:     General: No scleral icterus.       Right eye: No discharge.        Left eye: No discharge.     Extraocular Movements: Extraocular movements intact.     Conjunctiva/sclera: Conjunctivae normal.     Pupils: Pupils are equal, round, and reactive to light.  Cardiovascular:     Rate and Rhythm: Normal rate and regular rhythm.     Pulses: Normal pulses.     Heart sounds: Normal heart sounds. No murmur heard. Pulmonary:     Effort: Pulmonary effort is normal. No respiratory distress.     Breath sounds: Normal breath sounds. No wheezing or rhonchi.  Abdominal:     General: Abdomen is flat. Bowel sounds are normal.     Palpations: Abdomen is soft.  Musculoskeletal:        General: Normal range of motion.     Cervical back: Normal range of motion and neck supple.     Right lower leg: No edema.     Left lower leg: No edema.  Lymphadenopathy:     Cervical: No cervical adenopathy.  Skin:    General: Skin is warm and dry.     Findings: No rash.  Neurological:     General: No focal deficit present.     Mental Status: He is alert and oriented to person, place, and time. Mental status is at baseline.  Psychiatric:        Behavior: Behavior normal.        Thought Content: Thought content normal.      No results found for any visits on 06/25/23.  Assessment & Plan        Post viral syndrome/ post Upper Respiratory Symptoms Nasal congestion, postnasal drip, and cough (possibly related to Lisinopril use) with recent completion of a course of Amoxicillin. No sinus pain, fever, or lung abnormalities noted on examination. -Discontinue Lisinopril due to potential cough side effect. -Start Debrox for ear wax build-up/left ear -Start nasal saline rinse or spray for nasal congestion. -Start Flonase (2 puffs in each nostril for one week, then 1 puff in each  nostril until symptoms subside) for nasal congestion and postnasal drip. -Start over-the-counter antihistamine (Allegra, Claritin, or Zyrtec) for postnasal drip. -Consider use of a respiratory mask at work due to potential dust exposure.  General Health Maintenance -Continue drinking herbal teas and increase water intake. -Consider use of probiotics or yogurt to restore gut flora after antibiotic use. -Return for follow-up or send a message if symptoms worsen, fever develops, or  wheezing is noted.      No follow-ups on file.     The patient was advised to call back or seek Fernando in-person evaluation if the symptoms worsen or if the condition fails to improve as anticipated.  I discussed the assessment and treatment plan with the patient. The patient was provided Fernando opportunity to ask questions and all were answered. The patient agreed with the plan and demonstrated Fernando understanding of the instructions.  I, Debera Lat, PA-C have reviewed all documentation for this visit. The documentation on  06/25/23  for the exam, diagnosis, procedures, and orders are all accurate and complete.  Debera Lat, [facility], MMS [facility] [phone] (phone) [phone] (fax)  [facility]

## 2023-06-29 ENCOUNTER — Ambulatory Visit: Payer: Self-pay

## 2023-06-29 NOTE — Telephone Encounter (Signed)
Message from [place] M sent at 06/29/2023 10:00 AM EDT  Summary: Cough Advice   Pt was seen in office on 06/25/23. Pts wife is calling to report that the Pt is continuing to cough constantly. Please advise with the patient.         Reason for Disposition  [1] Follow-up call to recent contact AND [2] information only call, no triage required  Answer Assessment - Initial Assessment Questions 1. REASON FOR CALL or QUESTION: "What is your reason for calling today?" or "How can I best help you?" or "What question do you have that I can help answer?"     Pt stated that his wife was exaggerating. Pt stated that he is 95% better and that when he lays flat he coughs. He stopped the lisinopril and does not need anything. Pt stated his wife is the one who is sick. Addended her previous triage note.  Protocols used: Information Only Call - No Triage-A-AH

## 2023-08-03 ENCOUNTER — Other Ambulatory Visit: Payer: Self-pay

## 2023-08-03 ENCOUNTER — Telehealth: Payer: Self-pay | Admitting: Family Medicine

## 2023-08-03 DIAGNOSIS — M129 Arthropathy, unspecified: Secondary | ICD-10-CM

## 2023-08-03 MED ORDER — MELOXICAM 15 MG PO TABS
15.0000 mg | ORAL_TABLET | Freq: Every day | ORAL | 0 refills | Status: DC
Start: 2023-08-03 — End: 2023-10-12

## 2023-08-03 NOTE — Telephone Encounter (Signed)
[facility] is requesting prescription refill meloxicam (MOBIC) 15 MG tablet  Please advise

## 2023-10-08 ENCOUNTER — Other Ambulatory Visit: Payer: Self-pay | Admitting: Family Medicine

## 2023-10-08 ENCOUNTER — Telehealth: Payer: Self-pay | Admitting: Family Medicine

## 2023-10-08 DIAGNOSIS — M129 Arthropathy, unspecified: Secondary | ICD-10-CM

## 2023-10-08 DIAGNOSIS — I10 Essential (primary) hypertension: Secondary | ICD-10-CM

## 2023-10-08 NOTE — Telephone Encounter (Signed)
[facility] is requesting prescription refill hydrochlorothiazide (MICROZIDE) 12.5 MG capsule   Please advise

## 2023-10-08 NOTE — Telephone Encounter (Signed)
[facility] is requesting prescription refill lisinopril (ZESTRIL) 40 MG tablet   Please advise

## 2023-10-12 MED ORDER — HYDROCHLOROTHIAZIDE 12.5 MG PO CAPS
12.5000 mg | ORAL_CAPSULE | Freq: Every day | ORAL | 1 refills | Status: DC
Start: 2023-10-12 — End: 2024-01-04

## 2023-10-12 MED ORDER — LISINOPRIL 40 MG PO TABS
40.0000 mg | ORAL_TABLET | Freq: Every day | ORAL | 3 refills | Status: AC
Start: 2023-10-12 — End: ?

## 2023-11-20 ENCOUNTER — Encounter: Payer: Self-pay | Admitting: Family Medicine

## 2023-11-20 ENCOUNTER — Ambulatory Visit: Payer: Self-pay

## 2023-11-20 ENCOUNTER — Ambulatory Visit (INDEPENDENT_AMBULATORY_CARE_PROVIDER_SITE_OTHER): Payer: BC Managed Care – PPO | Admitting: Family Medicine

## 2023-11-20 VITALS — BP 127/61 | HR 90 | Temp 98.0°F | Resp 16 | Wt 255.6 lb

## 2023-11-20 DIAGNOSIS — R6889 Other general symptoms and signs: Secondary | ICD-10-CM | POA: Diagnosis not present

## 2023-11-20 DIAGNOSIS — J0110 Acute frontal sinusitis, unspecified: Secondary | ICD-10-CM | POA: Diagnosis not present

## 2023-11-20 LAB — POC COVID19/FLU A&B COMBO
Covid Antigen, POC: NEGATIVE
Influenza A Antigen, POC: NEGATIVE
Influenza B Antigen, POC: NEGATIVE

## 2023-11-20 MED ORDER — FLUTICASONE PROPIONATE 50 MCG/ACT NA SUSP: 2.0000 | Freq: Every day | NASAL | 1 refills | Status: DC

## 2023-11-20 MED ORDER — AZITHROMYCIN 250 MG PO TABS: ORAL_TABLET | ORAL | 0 refills | Status: AC

## 2023-11-20 NOTE — Telephone Encounter (Signed)
 Message from [place] A sent at 11/20/2023  8:22 AM EST  Summary: upper respriatory infection?   Pt states that he has an upper respiratory infection. No appts available. Please advise.    [facility] [address] - [place], [place] - [address] Phone: [phone] Fax: [phone]         Chief Complaint: cough, sore throat Symptoms: nasal congestion and subjective fever, muscle aches, chest soreness Frequency: last Wednesday Pertinent Negatives: Patient denies SOB, wheezing,  Disposition: [] ED /[] Urgent Care (no appt availability in office) / [x] Appointment(In office/virtual)/ []  [facility]/ [] Home Care/ [] Refused Recommended Disposition /[] [facility]/ []  Follow-up with PCP Additional Notes: Pt's wife thinks pt has pna.   Reason for Disposition  [1] Continuous (nonstop) coughing interferes with work or school AND [2] no improvement using cough treatment per Care Advice  Answer Assessment - Initial Assessment Questions 1. ONSET: When did the cough begin?      Last Wednesday  2. SEVERITY: How bad is the cough today?      Spells deep  3. SPUTUM: Describe the color of your sputum (none, dry cough; clear, white, yellow, green)     yellow 4. HEMOPTYSIS: Are you coughing up any blood? If so ask: How much? (flecks, streaks, tablespoons, etc.)     *No Answer* 5. DIFFICULTY BREATHING: Are you having difficulty breathing? If Yes, ask: How bad is it? (e.g., mild, moderate, severe)    - MILD: No SOB at rest, mild SOB with walking, speaks normally in sentences, can lie down, no retractions, pulse < 100.    - MODERATE: SOB at rest, SOB with minimal exertion and prefers to sit, cannot lie down flat, speaks in phrases, mild retractions, audible wheezing, pulse 100-120.    - SEVERE: Very SOB at rest, speaks in single words, struggling to breathe, sitting hunched forward, retractions, pulse > 120      Normal wakes with  cough 6. FEVER: Do you have a fever? If Yes, ask: What is your temperature, how was it measured, and when did it start?     *No Answer* 7. CARDIAC HISTORY: Do you have any history of heart disease? (e.g., heart attack, congestive heart failure)      HTN 8. LUNG HISTORY: Do you have any history of lung disease?  (e.g., pulmonary embolus, asthma, emphysema)     *No Answer* 9. PE RISK FACTORS: Do you have a history of blood clots? (or: recent major surgery, recent prolonged travel, bedridden)     *No Answer* 10. OTHER SYMPTOMS: Do you have any other symptoms? (e.g., runny nose, wheezing, chest pain)       Sinus congestion, scratchy throat , chest soreness, yellow nasal discharge, muscle aches  Protocols used: Cough - Acute Non-Productive-A-AH

## 2023-11-20 NOTE — Progress Notes (Signed)
 Established patient visit   Patient: Fernando Arias   DOB: [birth year]   63 y.o. Male  MRN: [id number] Visit Date: 11/20/2023  Today's healthcare provider: Nancyann Perry, MD   Chief Complaint  Patient presents with   URI    Patient with sore throat not any more just scratchy, cough, fever, muscle ache, chest soreness..   Subjective    Discussed the use of AI scribe software for clinical note transcription with the patient, who gave verbal consent to proceed.  History of Present Illness   Fernando Arias is a 63 year old male who presents with a sore throat and increased mucus production.  He has been experiencing a sore throat and increased mucus production for the past ten days, beginning last Wednesday. Initially, he noticed a strange feeling in the back of his throat and clear mucus production. Over the past three to four days, he has been trying to expel the mucus, which remains clear. He describes a scratchy throat that leads to coughing, which he finds painful. No shortness of breath is present.  He has a history of postnasal drip, which worsens when he eats, although it is usually clear. He has not used saline spray during this episode but has used it in the past. He has not used any steroid nasal sprays like Flonase  or Nasacort recently.  For symptom relief, he has been using over-the-counter medications such as a knockoff Nyquil with alcohol, chloraseptic spray for throat itchiness, and Ludum's cough drops. He has also been consuming chicken soup and clear broth.       Medications: Outpatient Medications Prior to Visit  Medication Sig   Aspirin-Salicylamide-Caffeine (BC HEADACHE POWDER PO) Take 1 packet by mouth daily as needed.   calcium carbonate (OS-CAL) 1250 (500 Ca) MG chewable tablet Chew 1 tablet by mouth daily.   DULoxetine  (CYMBALTA ) 60 MG capsule Take 1 capsule (60 mg total) by mouth daily.   hydrochlorothiazide  (MICROZIDE ) 12.5 MG capsule Take 1 capsule (12.5  mg total) by mouth daily.   lisinopril  (ZESTRIL ) 40 MG tablet Take 1 tablet (40 mg total) by mouth daily.   meloxicam  (MOBIC ) 15 MG tablet TAKE ONE TABLET BY MOUTH ONE TIME DAILY   Multiple Vitamins-Minerals (CENTRUM SILVER 50+MEN) TABS Take 1 tablet by mouth daily.   No facility-administered medications prior to visit.   Review of Systems     Objective    BP 127/61 (BP Location: Left Arm, Patient Position: Sitting, Cuff Size: Large)   Pulse 90   Temp 98 F (36.7 C) (Oral)   Resp 16   Wt 255 lb 9.6 oz (115.9 kg)   SpO2 98%   BMI 32.82 kg/m    Physical Exam   HEENT: Right ear with wax accumulation, no infection observed. Left ear clear. Nasal passages congested. Throat shows copious thick yellow-green pharyngeal drainage CHEST: Lungs and bronchial tubes clear.     Flu & Covid tests negative.   Assessment & Plan       Sinusitus Symptoms of throat discomfort, increased clear mucus production, and cough for 10 days. Examination reveals infectious appearing postnasal drainage. No shortness of breath or lung abnormalities noted on auscultation. Negative for COVID and flu. -Start Azithromycin  (Z-Pak) for suspected bacterial sinusitis. -Continue symptomatic treatment with over-the-counter medications as tolerated.  Reports of constant postnasal drip  -Start Flonase  nasal spray to reduce sinus drainage. -Continue saline spray as needed.  Follow-up If symptoms do not significantly improve after completing the Z-Pak, patient  should contact the office for potential alternative treatment.         Nancyann Perry, MD  [facility] [phone] (phone) [phone] (fax)  [facility]

## 2024-01-04 ENCOUNTER — Encounter: Payer: Self-pay | Admitting: Family Medicine

## 2024-01-04 ENCOUNTER — Ambulatory Visit (INDEPENDENT_AMBULATORY_CARE_PROVIDER_SITE_OTHER): Payer: BC Managed Care – PPO | Admitting: Family Medicine

## 2024-01-04 VITALS — BP 123/83 | HR 76 | Ht 74.0 in | Wt 256.0 lb

## 2024-01-04 DIAGNOSIS — M170 Bilateral primary osteoarthritis of knee: Secondary | ICD-10-CM | POA: Insufficient documentation

## 2024-01-04 DIAGNOSIS — D563 Thalassemia minor: Secondary | ICD-10-CM

## 2024-01-04 DIAGNOSIS — N182 Chronic kidney disease, stage 2 (mild): Secondary | ICD-10-CM

## 2024-01-04 DIAGNOSIS — Z0001 Encounter for general adult medical examination with abnormal findings: Secondary | ICD-10-CM

## 2024-01-04 DIAGNOSIS — I10 Essential (primary) hypertension: Secondary | ICD-10-CM

## 2024-01-04 DIAGNOSIS — N401 Enlarged prostate with lower urinary tract symptoms: Secondary | ICD-10-CM

## 2024-01-04 DIAGNOSIS — K219 Gastro-esophageal reflux disease without esophagitis: Secondary | ICD-10-CM

## 2024-01-04 DIAGNOSIS — E66811 Obesity, class 1: Secondary | ICD-10-CM

## 2024-01-04 DIAGNOSIS — Z130 Encounter for screening for diseases of the blood and blood-forming organs and certain disorders involving the immune mechanism: Secondary | ICD-10-CM

## 2024-01-04 DIAGNOSIS — M129 Arthropathy, unspecified: Secondary | ICD-10-CM

## 2024-01-04 DIAGNOSIS — R351 Nocturia: Secondary | ICD-10-CM

## 2024-01-04 DIAGNOSIS — Z1329 Encounter for screening for other suspected endocrine disorder: Secondary | ICD-10-CM

## 2024-01-04 DIAGNOSIS — R5382 Chronic fatigue, unspecified: Secondary | ICD-10-CM

## 2024-01-04 DIAGNOSIS — Z0000 Encounter for general adult medical examination without abnormal findings: Secondary | ICD-10-CM | POA: Insufficient documentation

## 2024-01-04 MED ORDER — HYDROCHLOROTHIAZIDE 12.5 MG PO CAPS: 12.5000 mg | ORAL_CAPSULE | Freq: Every day | ORAL | 1 refills | Status: DC

## 2024-01-04 MED ORDER — NALTREXONE HCL 50 MG PO TABS: 25.0000 mg | ORAL_TABLET | Freq: Every day | ORAL | 2 refills | Status: DC

## 2024-01-04 MED ORDER — BUPROPION HCL ER (XL) 150 MG PO TB24: ORAL_TABLET | ORAL | 2 refills | Status: DC

## 2024-01-04 MED ORDER — FINASTERIDE 5 MG PO TABS: 5.0000 mg | ORAL_TABLET | Freq: Every day | ORAL | 2 refills | Status: DC

## 2024-01-04 MED ORDER — ETODOLAC 300 MG PO CAPS: 300.0000 mg | ORAL_CAPSULE | Freq: Three times a day (TID) | ORAL | 3 refills | Status: DC | PRN

## 2024-01-04 NOTE — Assessment & Plan Note (Signed)
 Weight loss efforts hindered by knee pain. Interested in GLP-1 medications but insurance coverage uncertain. Discussed alternative options including Contrave and topiramate. - Prescribe Contrave (sending generic equivalent bupropion and naltrexone) for weight loss. - Encourage continued diet and exercise modifications.

## 2024-01-04 NOTE — Assessment & Plan Note (Signed)
 Stable, chronic.  Continue lisinopril 40 mg daily Continue hydrochlorothiazide 12.5 mg daily

## 2024-01-04 NOTE — Assessment & Plan Note (Signed)
 Nocturia and urination difficulty. Limited success with tamsulosin. Interested in finasteride. - Consider prescribing finasteride (Proscar) for BPH management.

## 2024-01-04 NOTE — Assessment & Plan Note (Signed)
 Physical exam overall unremarkable except as noted above. Routine lab work ordered as noted.

## 2024-01-04 NOTE — Assessment & Plan Note (Signed)
 Fatigue possibly due to anemia and beta thalassemia trait. Vitamin D and B12 levels not supplemented. - Order complete blood count, vitamin D, and B12 levels to assess for deficiencies.

## 2024-01-04 NOTE — Assessment & Plan Note (Deleted)
 Significant knee pain limits physical activity. Cost concerns prevent knee injections or physical therapy. Interested in low-impact exercises. - Continue meloxicam for pain management. - Discuss potential use of etodolac as an alternative to meloxicam. - Encourage low-impact exercises such as cycling with a higher seat or using an elliptical.

## 2024-01-04 NOTE — Progress Notes (Signed)
 Complete physical exam   Patient: Fernando Arias   DOB: 1960/12/01   63 y.o. Male  MRN: [id number] Visit Date: 01/04/2024  Today's healthcare provider: Sherlyn Hay, DO   Chief Complaint  Patient presents with   Annual Exam    No concerns    Subjective    Fernando Arias is a 63 y.o. male who presents today for a complete physical exam.  He reports consuming a general diet. The patient does not participate in regular exercise at present. He generally feels fairly well. He reports sleeping fairly well. He does have additional problems to discuss today.  HPI HPI     Annual Exam    Additional comments: No concerns       Last edited by Thedora Hinders, CMA on 01/04/2024  8:08 AM.      Fernando Arias is a 63 year old male who presents for a physical exam and weight management.  He is concerned about his weight, which has fluctuated significantly over time. He initially lost weight from 270 lbs to 210 lbs following a kidney stone incident but has since regained weight to 256 lbs. He is classified as obese and is interested in exploring weight loss medications, specifically GLP-1s, but is unsure about insurance coverage. Despite maintaining an active lifestyle, he has been unable to lose weight and is seeking assistance. He is also considering other weight loss options, including medications like Contrave and topiramate, but is concerned about side effects and insurance coverage. He denies depression-related eating but acknowledges a fondness for certain foods. His wife comments that he 'never eats,' and he is puzzled by his inability to lose weight.  He experiences significant knee pain, which he attributes to arthritis, limiting his ability to engage in activities like yoga. He wears knee pads at work and has considered visiting the [facility] but has not pursued it due to economic concerns. He can perform certain activities like push-ups but struggles with deep knee  bends. 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 He describes a sensation in his neck that coincides with shoulder pain. He is currently on duloxetine and meloxicam, which help manage his symptoms, but he still experiences significant pain.  He experiences urinary symptoms, including difficulty with urination at night. He has tried Flomax but found it unsatisfactory due to side effects. He has not tried other medications like finasteride. He reports that his knees feel swollen after a long day at work, with a noticeable 'click, click' sensation when moving.  He reports occasional chest pain, which he attributes to heartburn, especially when he has not eaten. Eating alleviates the pain, and he does not take any specific medication for it. He has a history of GERD, which he notes is a family trait and tends to flare up when he is stressed.  He has a history of anemia, specifically beta thalassemia trait, and experiences fatigue. He is not currently taking vitamin D or B12 supplements. He also reports a bad back with calcification noted on a previous MRI, but this does not significantly limit his activities. He uses an inversion table occasionally for relief.  He reports postnasal drip and uses a nasal spray, which provides some relief. He has not tried allergy medications but is considering them. He also notes that his  prodigal son has moved back home, which has been a source of stress.    Past Medical History:  Diagnosis Date   Allergy    Anemia    Anxiety    Arthritis    Depression    GERD (gastroesophageal reflux disease)    Insomnia    Slipped disc in neck    Past Surgical History:  Procedure Laterality Date   VASECTOMY     Social History   Socioeconomic History   Marital status: Married    Spouse  name: Not on file   Number of children: Not on file   Years of education: Not on file   Highest education level: 12th grade  Occupational History   Not on file  Tobacco Use   Smoking status: Never   Smokeless tobacco: Never  Substance and Sexual Activity   Alcohol use: Never   Drug use: Never   Sexual activity: Not on file  Other Topics Concern   Not on file  Social History Narrative   Not on file   Social Drivers of Health   Financial Resource Strain: Medium Risk (01/04/2024)   Overall Financial Resource Strain (CARDIA)    Difficulty of Paying Living Expenses: Somewhat hard  Food Insecurity: No Food Insecurity (01/04/2024)   Hunger Vital Sign    Worried About Running Out of Food in the Last Year: Never true    Ran Out of Food in the Last Year: Never true  Transportation Needs: No Transportation Needs (01/04/2024)   PRAPARE - Administrator, Civil Service (Medical): No    Lack of Transportation (Non-Medical): No  Physical Activity: Sufficiently Active (03/22/2023)   Exercise Vital Sign    Days of Exercise per Week: 4 days    Minutes of Exercise per Session: 60 min  Stress: No Stress Concern Present (03/22/2023)   [employer] of Occupational Health - Occupational Stress Questionnaire    Feeling of Stress : Only a little  Social Connections: Socially Isolated (03/22/2023)   Social Connection and Isolation Panel [NHANES]    Frequency of Communication with Friends and Family: Never    Frequency of Social Gatherings with Friends and Family: Never    Attends Religious Services: Never    Database administrator or Organizations: No    Attends Engineer, structural: Not on file    Marital Status: Married  Catering manager Violence: Not At Risk (01/04/2024)   Humiliation, Afraid, Rape, and Kick questionnaire    Fear of Current or Ex-Partner: No    Emotionally Abused: No    Physically Abused: No    Sexually Abused: No   Family Status  Relation Name Status    Mother  Deceased   Sister  Alive   Sister  Alive   Brother  Alive   MGM  Deceased   MGF  Deceased  No partnership data on file   Family History  Problem Relation Age of Onset   Diabetes Mother    GER disease Sister    GER disease Sister    GER disease Brother    Diabetes Maternal Grandmother    Cancer - Lung Maternal Grandfather    No Known Allergies  Patient Care Team: Kilan Banfill, Monico Blitz, DO as PCP - General (Family Medicine) Orlie Dakin, Tollie Pizza, MD as Consulting Physician (Hematology and Oncology)   Medications: Outpatient Medications Prior to Visit  Medication Sig   Aspirin-Salicylamide-Caffeine (BC HEADACHE POWDER PO) Take 1 packet by mouth daily as needed.  calcium carbonate (OS-CAL) 1250 (500 Ca) MG chewable tablet Chew 1 tablet by mouth daily.   DULoxetine (CYMBALTA) 60 MG capsule Take 1 capsule (60 mg total) by mouth daily.   fluticasone (FLONASE) 50 MCG/ACT nasal spray Place 2 sprays into both nostrils daily.   lisinopril (ZESTRIL) 40 MG tablet Take 1 tablet (40 mg total) by mouth daily.   meloxicam (MOBIC) 15 MG tablet TAKE ONE TABLET BY MOUTH ONE TIME DAILY   Multiple Vitamins-Minerals (CENTRUM SILVER 50+MEN) TABS Take 1 tablet by mouth daily.   [DISCONTINUED] hydrochlorothiazide (MICROZIDE) 12.5 MG capsule Take 1 capsule (12.5 mg total) by mouth daily.   No facility-administered medications prior to visit.    Review of Systems  Constitutional:  Negative for appetite change, chills, fatigue and fever.  HENT:  Negative for congestion, ear pain, hearing loss, nosebleeds and trouble swallowing.   Eyes:  Negative for pain and visual disturbance.  Respiratory:  Negative for cough, chest tightness and shortness of breath.   Cardiovascular:  Negative for chest pain, palpitations and leg swelling.  Gastrointestinal:  Positive for abdominal pain (intermittent across upper abdomen; eating improves it). Negative for blood in stool, constipation, diarrhea, nausea and vomiting.   Endocrine: Negative for polydipsia, polyphagia and polyuria.  Genitourinary:  Positive for difficulty urinating. Negative for dysuria and flank pain.       +nocturia  Musculoskeletal:  Negative for arthralgias, back pain, joint swelling, myalgias and neck stiffness.  Skin:  Negative for color change, rash and wound.  Neurological:  Negative for dizziness, tremors, seizures, speech difficulty, weakness, light-headedness and headaches.  Psychiatric/Behavioral:  Negative for behavioral problems, confusion, decreased concentration, dysphoric mood and sleep disturbance. The patient is not nervous/anxious.   All other systems reviewed and are negative.     Objective    BP 123/83   Pulse 76   Ht 6\' 2"  (1.88 m)   Wt 256 lb (116.1 kg)   SpO2 100%   BMI 32.87 kg/m    Physical Exam Vitals and nursing note reviewed.  Constitutional:      General: He is awake.     Appearance: Normal appearance.  HENT:     Head: Normocephalic and atraumatic.     Right Ear: Tympanic membrane, ear canal and external ear normal.     Left Ear: Tympanic membrane, ear canal and external ear normal. Decreased hearing noted.     Ears:     Comments: Cerumen present bilaterally; canal not occluded. No impaction present.    Nose: Nose normal.     Mouth/Throat:     Mouth: Mucous membranes are moist.     Pharynx: Oropharynx is clear. No oropharyngeal exudate or posterior oropharyngeal erythema.  Eyes:     General: No scleral icterus.    Extraocular Movements: Extraocular movements intact.     Conjunctiva/sclera: Conjunctivae normal.     Pupils: Pupils are equal, round, and reactive to light.  Neck:     Thyroid: No thyromegaly or thyroid tenderness.  Cardiovascular:     Rate and Rhythm: Normal rate and regular rhythm.     Pulses: Normal pulses.     Heart sounds: Normal heart sounds.  Pulmonary:     Effort: Pulmonary effort is normal. No tachypnea, bradypnea or respiratory distress.     Breath sounds: Normal  breath sounds. No stridor. No wheezing, rhonchi or rales.  Abdominal:     General: Bowel sounds are normal. There is no distension.     Palpations: Abdomen is soft. There is  no mass.     Tenderness: There is no abdominal tenderness. There is no guarding.     Hernia: No hernia is present.  Musculoskeletal:     Cervical back: Normal range of motion and neck supple.     Right knee: Crepitus present.     Left knee: Crepitus present.     Right lower leg: No edema.     Left lower leg: No edema.  Lymphadenopathy:     Cervical: No cervical adenopathy.  Skin:    General: Skin is warm and dry.  Neurological:     Mental Status: He is alert and oriented to person, place, and time. Mental status is at baseline.  Psychiatric:        Mood and Affect: Mood normal.        Behavior: Behavior normal.      Last depression screening scores    11/20/2023    1:41 PM 06/25/2023    2:55 PM 03/26/2023   11:14 AM  PHQ 2/9 Scores  PHQ - 2 Score 2 2 3   PHQ- 9 Score 12 7 10    Last fall risk screening    11/20/2023    1:41 PM  Fall Risk   Falls in the past year? 0  Number falls in past yr: 0  Injury with Fall? 0  Risk for fall due to : No Fall Risks   Last Audit-C alcohol use screening    01/04/2024    8:11 AM  Alcohol Use Disorder Test (AUDIT)  1. How often do you have a drink containing alcohol? 0  2. How many drinks containing alcohol do you have on a typical day when you are drinking? 0  3. How often do you have six or more drinks on one occasion? 0  AUDIT-C Score 0   A score of 3 or more in women, and 4 or more in men indicates increased risk for alcohol abuse, EXCEPT if all of the points are from question 1   No results found for any visits on 01/04/24.  Assessment & Plan    Routine Health Maintenance and Physical Exam  Exercise Activities and Dietary recommendations  Goals      Increase physical activity     Weight (lb) < 210 lb (95.3 kg)        Immunization History   Administered Date(s) Administered   PFIZER Comirnaty(Gray Top)Covid-19 Tri-Sucrose Vaccine 07/05/2020, 07/27/2020   Td 12/23/2021   Zoster Recombinant(Shingrix) 12/23/2021, 05/12/2022    Health Maintenance  Topic Date Due   INFLUENZA VACCINE  01/11/2024 (Originally 05/14/2023)   COVID-19 Vaccine (3 - Pfizer risk series) 07/13/2024 (Originally 08/24/2020)   Colonoscopy  01/03/2025 (Originally 11/03/2006)   DTaP/Tdap/Td (2 - Tdap) 12/24/2031   Hepatitis C Screening  Completed   HIV Screening  Completed   Zoster Vaccines- Shingrix  Completed   HPV VACCINES  Aged Out    Discussed health benefits of physical activity, and encouraged him to engage in regular exercise appropriate for his age and condition.   Annual physical exam Assessment & Plan: Physical exam overall unremarkable except as noted above. Routine lab work ordered as noted.   Primary hypertension Assessment & Plan: Stable, chronic.  Continue lisinopril 40 mg daily Continue hydrochlorothiazide 12.5 mg daily  Orders: -     Comprehensive metabolic panel -     Lipid panel -     hydroCHLOROthiazide; Take 1 capsule (12.5 mg total) by mouth daily.  Dispense: 90 capsule; Refill: 1  Chronic  kidney disease, stage 2, mildly decreased GFR -     Microalbumin / creatinine urine ratio  Beta thalassemia minor -     CBC  Obesity (BMI 30.0-34.9) Assessment & Plan: Weight loss efforts hindered by knee pain. Interested in GLP-1 medications but insurance coverage uncertain. Discussed alternative options including Contrave and topiramate. - Prescribe Contrave (sending generic equivalent bupropion and naltrexone) for weight loss. - Encourage continued diet and exercise modifications.  Orders: -     Hemoglobin A1c -     buPROPion HCl ER (XL); 150 mg orally once daily for 3 days, then increase to 300 mg/day for 11 days  Dispense: 60 tablet; Refill: 2 -     Naltrexone HCl; Take 0.5 tablets (25 mg total) by mouth daily.  Dispense: 15  tablet; Refill: 2  Chronic fatigue Assessment & Plan: Fatigue possibly due to anemia and beta thalassemia trait. Vitamin D and B12 levels not supplemented. - Order complete blood count, vitamin D, and B12 levels to assess for deficiencies.  Orders: -     VITAMIN D 25 Hydroxy (Vit-D Deficiency, Fractures) -     Vitamin B12 -     CBC  Benign prostatic hyperplasia with nocturia Assessment & Plan: Nocturia and urination difficulty. Limited success with tamsulosin. Interested in finasteride. - Consider prescribing finasteride (Proscar) for BPH management.  Orders: -     Finasteride; Take 1 tablet (5 mg total) by mouth daily.  Dispense: 30 tablet; Refill: 2  Screening for endocrine, metabolic and immunity disorder -     Hemoglobin A1c  Gastroesophageal reflux disease, unspecified whether esophagitis present Assessment & Plan: Intermittent chest pain related to heartburn. No current medication. - Consider use of antacids like Tums for symptomatic relief.   Arthritis, multiple joint involvement Assessment & Plan: Significant shoulder and knee pain limits physical activity. Cost concerns prevent knee injections or physical therapy. Interested in low-impact exercises.  Pain interferes with sleep, possibly arthritis-related. - Stop meloxicam  - Discuss potential use of etodolac as an alternative to meloxicam.  Prescribe etodolac. - Encourage low-impact exercises such as cycling with a higher seat or using an elliptical.  Orders: -     Etodolac; Take 1 capsule (300 mg total) by mouth every 8 (eight) hours as needed.  Dispense: 90 capsule; Refill: 3    Return in about 3 months (around 04/05/2024) for Weight.     I discussed the assessment and treatment plan with the patient  The patient was provided an opportunity to ask questions and all were answered. The patient agreed with the plan and demonstrated an understanding of the instructions.   The patient was advised to call back or  seek an in-person evaluation if the symptoms worsen or if the condition fails to improve as anticipated.    Sherlyn Hay, DO  [facility] [phone] (phone) [phone] (fax)  [facility]

## 2024-01-04 NOTE — Assessment & Plan Note (Signed)
 Intermittent chest pain related to heartburn. No current medication. - Consider use of antacids like Tums for symptomatic relief.

## 2024-01-04 NOTE — Assessment & Plan Note (Signed)
 Significant shoulder and knee pain limits physical activity. Cost concerns prevent knee injections or physical therapy. Interested in low-impact exercises.  Pain interferes with sleep, possibly arthritis-related. - Stop meloxicam  - Discuss potential use of etodolac as an alternative to meloxicam.  Prescribe etodolac. - Encourage low-impact exercises such as cycling with a higher seat or using an elliptical.

## 2024-01-05 LAB — COMPREHENSIVE METABOLIC PANEL
ALT: 19 IU/L (ref 0–44)
AST: 19 IU/L (ref 0–40)
Albumin: 4.2 g/dL (ref 3.9–4.9)
Alkaline Phosphatase: 112 IU/L (ref 44–121)
BUN/Creatinine Ratio: 16 (ref 10–24)
BUN: 22 mg/dL (ref 8–27)
Bilirubin Total: 0.7 mg/dL (ref 0.0–1.2)
CO2: 23 mmol/L (ref 20–29)
Calcium: 9.5 mg/dL (ref 8.6–10.2)
Chloride: 103 mmol/L (ref 96–106)
Creatinine, Ser: 1.39 mg/dL — ABNORMAL HIGH (ref 0.76–1.27)
Globulin, Total: 2.1 g/dL (ref 1.5–4.5)
Glucose: 100 mg/dL — ABNORMAL HIGH (ref 70–99)
Potassium: 4.8 mmol/L (ref 3.5–5.2)
Sodium: 141 mmol/L (ref 134–144)
Total Protein: 6.3 g/dL (ref 6.0–8.5)
eGFR: 57 mL/min/{1.73_m2} — ABNORMAL LOW (ref >59)

## 2024-01-05 LAB — LIPID PANEL
Chol/HDL Ratio: 2.9 ratio (ref 0.0–5.0)
Cholesterol, Total: 150 mg/dL (ref 100–199)
HDL: 52 mg/dL (ref >39)
LDL Chol Calc (NIH): 84 mg/dL (ref 0–99)
Triglycerides: 73 mg/dL (ref 0–149)
VLDL Cholesterol Cal: 14 mg/dL (ref 5–40)

## 2024-01-05 LAB — CBC
Hematocrit: 38.6 % (ref 37.5–51.0)
Hemoglobin: 11.7 g/dL — ABNORMAL LOW (ref 13.0–17.7)
MCH: 20.7 pg — ABNORMAL LOW (ref 26.6–33.0)
MCHC: 30.3 g/dL — ABNORMAL LOW (ref 31.5–35.7)
MCV: 68 fL — ABNORMAL LOW (ref 79–97)
Platelets: 250 10*3/uL (ref 150–450)
RBC: 5.64 x10E6/uL (ref 4.14–5.80)
RDW: 17.5 % — ABNORMAL HIGH (ref 11.6–15.4)
WBC: 7.5 10*3/uL (ref 3.4–10.8)

## 2024-01-05 LAB — MICROALBUMIN / CREATININE URINE RATIO
Creatinine, Urine: 163.3 mg/dL
Microalb/Creat Ratio: 6 mg/g{creat} (ref 0–29)
Microalbumin, Urine: 10.1 ug/mL

## 2024-01-05 LAB — VITAMIN D 25 HYDROXY (VIT D DEFICIENCY, FRACTURES): Vit D, 25-Hydroxy: 35.1 ng/mL (ref 30.0–100.0)

## 2024-01-05 LAB — VITAMIN B12: Vitamin B-12: 627 pg/mL (ref 232–1245)

## 2024-01-05 LAB — HEMOGLOBIN A1C
Est. average glucose Bld gHb Est-mCnc: 111 mg/dL
Hgb A1c MFr Bld: 5.5 % (ref 4.8–5.6)

## 2024-01-07 ENCOUNTER — Encounter: Payer: Self-pay | Admitting: Family Medicine

## 2024-01-18 ENCOUNTER — Encounter: Payer: Self-pay | Admitting: Family Medicine

## 2024-04-05 ENCOUNTER — Ambulatory Visit: Admitting: Family Medicine

## 2024-04-05 ENCOUNTER — Encounter: Payer: Self-pay | Admitting: Family Medicine

## 2024-04-05 VITALS — BP 120/73 | HR 77 | Ht 74.0 in | Wt 263.6 lb

## 2024-04-05 DIAGNOSIS — M129 Arthropathy, unspecified: Secondary | ICD-10-CM | POA: Diagnosis not present

## 2024-04-05 DIAGNOSIS — F339 Major depressive disorder, recurrent, unspecified: Secondary | ICD-10-CM | POA: Diagnosis not present

## 2024-04-05 DIAGNOSIS — R635 Abnormal weight gain: Secondary | ICD-10-CM

## 2024-04-05 DIAGNOSIS — E66811 Obesity, class 1: Secondary | ICD-10-CM | POA: Diagnosis not present

## 2024-04-05 DIAGNOSIS — E782 Mixed hyperlipidemia: Secondary | ICD-10-CM

## 2024-04-05 DIAGNOSIS — G8929 Other chronic pain: Secondary | ICD-10-CM

## 2024-04-05 DIAGNOSIS — Z136 Encounter for screening for cardiovascular disorders: Secondary | ICD-10-CM

## 2024-04-05 DIAGNOSIS — Z713 Dietary counseling and surveillance: Secondary | ICD-10-CM

## 2024-04-05 MED ORDER — SEMAGLUTIDE-WEIGHT MANAGEMENT 0.5 MG/0.5ML ~~LOC~~ SOAJ: 0.5000 mg | SUBCUTANEOUS | 1 refills | Status: DC

## 2024-04-05 MED ORDER — DULOXETINE HCL 60 MG PO CPEP: 60.0000 mg | ORAL_CAPSULE | Freq: Every day | ORAL | 3 refills | Status: AC

## 2024-04-05 MED ORDER — MELOXICAM 15 MG PO TABS
15.0000 mg | ORAL_TABLET | Freq: Every day | ORAL | 0 refills | Status: DC
Start: 2024-04-05 — End: 2024-07-06

## 2024-04-05 MED ORDER — SEMAGLUTIDE-WEIGHT MANAGEMENT 0.25 MG/0.5ML ~~LOC~~ SOAJ
0.2500 mg | SUBCUTANEOUS | 0 refills | Status: AC
Start: 2024-04-05 — End: 2024-05-03

## 2024-04-05 MED ORDER — CYCLOBENZAPRINE HCL 5 MG PO TABS: 5.0000 mg | ORAL_TABLET | Freq: Two times a day (BID) | ORAL | 1 refills | Status: AC | PRN

## 2024-04-05 NOTE — Progress Notes (Signed)
 Established patient visit   Patient: Fernando Arias   DOB: [birth year]   63 y.o. Male  MRN: [id number] Visit Date: 04/05/2024  Today's healthcare provider: LAURAINE LOISE BUOY, DO   Chief Complaint  Patient presents with   Weight Check   Subjective    HPI Fernando Arias is a 63 year old male who presents with generalized joint pain and medication management.  He experiences severe joint pain, describing that 'everything hurts' and 'all my joints hurt.' His knees are particularly affected, making him 'really scared of stairs.' Despite the pain, he manages to walk over 6,000 steps daily. He has not seen an orthopedic specialist previously and describes his knees as 'shot.'  He has a history of medication use for pain management, including duloxetine , which he stopped due to concerns about medication interactions. He also tried etodolac , which had no effect, and meloxicam , which provided minimal relief. He is currently not taking any pain medication except for [facility] Arth[facility] Strength.  He is currently taking lisinopril  for blood pressure and hydrochlorothiazide  , along with a multivitamin. He has stopped using Flonase  despite ongoing post-nasal drip, which he notes is a hereditary issue in his family.  He experiences significant constipation, which he attributes to previous medication use, describing it as 'locked my system up solid.' He has stopped all medications except for those managing his blood pressure and thyroid.  He reports frequent urination, getting up four to five times a night, and has previously tried finasteride  and tamsulosin  without noticeable improvement. Reports snoring; denies waking up with headaches, but he feels 'exhausted all the time.'  His social history includes stress related to his 39 year old son moving back home, which he describes as challenging. He is trying to guide his son positively but finds it difficult. He mentions that his son is 'pretty screwed up' and  requires a lot of support. He also shares a personal interest in literature, recommending a story by Tolstoy ([employer] [place] [employer]).      Medications: Outpatient Medications Prior to Visit  Medication Sig   Aspirin-Salicylamide-Caffeine (BC HEADACHE POWDER PO) Take 1 packet by mouth daily as needed.   calcium carbonate (OS-CAL) 1250 (500 Ca) MG chewable tablet Chew 1 tablet by mouth daily.   finasteride  (PROSCAR ) 5 MG tablet Take 1 tablet (5 mg total) by mouth daily.   hydrochlorothiazide  (MICROZIDE ) 12.5 MG capsule Take 1 capsule (12.5 mg total) by mouth daily.   lisinopril  (ZESTRIL ) 40 MG tablet Take 1 tablet (40 mg total) by mouth daily.   Multiple Vitamins-Minerals (CENTRUM SILVER 50+MEN) TABS Take 1 tablet by mouth daily.   [DISCONTINUED] buPROPion  (WELLBUTRIN  XL) 150 MG 24 hr tablet 150 mg orally once daily for 3 days, then increase to 300 mg/day for 11 days   [DISCONTINUED] etodolac  (LODINE ) 300 MG capsule Take 1 capsule (300 mg total) by mouth every 8 (eight) hours as needed.   [DISCONTINUED] fluticasone  (FLONASE ) 50 MCG/ACT nasal spray Place 2 sprays into both nostrils daily.   [DISCONTINUED] meloxicam  (MOBIC ) 15 MG tablet TAKE ONE TABLET BY MOUTH ONE TIME DAILY   [DISCONTINUED] naltrexone  (DEPADE) 50 MG tablet Take 0.5 tablets (25 mg total) by mouth daily.   [DISCONTINUED] DULoxetine  (CYMBALTA ) 60 MG capsule Take 1 capsule (60 mg total) by mouth daily. (Patient not taking: Reported on 04/05/2024)   No facility-administered medications prior to visit.        Objective    BP 120/73 (BP Location: Left Arm, Patient Position: Sitting, Cuff Size: Large)  Pulse 77   Ht 6' 2 (1.88 m)   Wt 263 lb 9.6 oz (119.6 kg)   SpO2 100%   BMI 33.84 kg/m     Physical Exam Vitals and nursing note reviewed.  Constitutional:      General: He is not in acute distress.    Appearance: Normal appearance.  HENT:     Head: Normocephalic and atraumatic.   Eyes:     General: No scleral  icterus.    Conjunctiva/sclera: Conjunctivae normal.    Cardiovascular:     Rate and Rhythm: Normal rate.  Pulmonary:     Effort: Pulmonary effort is normal.   Neurological:     Mental Status: He is alert and oriented to person, place, and time. Mental status is at baseline.   Psychiatric:        Mood and Affect: Mood normal.        Behavior: Behavior normal.      No results found for any visits on 04/05/24.  Assessment & Plan    Arthritis, multiple joint involvement -     DULoxetine  HCl; Take 1 capsule (60 mg total) by mouth daily.  Dispense: 90 capsule; Refill: 3 -     Ambulatory referral to Orthopedic Surgery -     Meloxicam ; Take 1 tablet (15 mg total) by mouth daily.  Dispense: 90 tablet; Refill: 0 -     Cyclobenzaprine HCl; Take 1-2 tablets (5-10 mg total) by mouth 2 (two) times daily as needed for muscle spasms.  Dispense: 90 tablet; Refill: 1  Other chronic pain  Depression, recurrent (HCC) -     DULoxetine  HCl; Take 1 capsule (60 mg total) by mouth daily.  Dispense: 90 capsule; Refill: 3  Obesity (BMI 30.0-34.9) -     Semaglutide-Weight Management; Inject 0.25 mg into the skin once a week for 28 days.  Dispense: 2 mL; Refill: 0 -     Semaglutide-Weight Management; Inject 0.5 mg into the skin once a week.  Dispense: 2 mL; Refill: 1  Weight gain -     Semaglutide-Weight Management; Inject 0.25 mg into the skin once a week for 28 days.  Dispense: 2 mL; Refill: 0 -     Semaglutide-Weight Management; Inject 0.5 mg into the skin once a week.  Dispense: 2 mL; Refill: 1  Weight loss counseling, encounter for -     Semaglutide-Weight Management; Inject 0.25 mg into the skin once a week for 28 days.  Dispense: 2 mL; Refill: 0 -     Semaglutide-Weight Management; Inject 0.5 mg into the skin once a week.  Dispense: 2 mL; Refill: 1  Encounter for screening for cardiovascular disorders -     CT CARDIAC SCORING (SELF PAY ONLY); Future  Mixed hyperlipidemia -     CT CARDIAC  SCORING (SELF PAY ONLY); Future      Arthritis, multiple joint involvement;  Chronic Pain Widespread joint pain, particularly in knees, worsened by stopping duloxetine . Limited response to etodolac  and meloxicam . Open to orthopedic evaluation. Discussed Flexeril for muscle relaxation. - Restart duloxetine  60 mg daily. - Consider increasing meloxicam  dosage. - Refer to orthopedic specialist for knee evaluation. - Prescribe Flexeril as needed.  Obesity; weight gain; weight loss counseling Interested in weight management. Adverse effects from bupropion  and naltrexone  (severe constipation and abdominal pain). Discussed Wegovy, its side effects, and initial nontherapeutic dose. Advised on continued reduced caloric intake and physical activity within physical limitations. - Prescribe [employer] pending insurance approval. - Advise on continued reduced  caloric intake of no more than 1800 calories per day and continued physical activity within patient's physical limitations.  His severe chronic knee pain heavily limits his ability to exercise.  Depression recurrent We will restart patient's duloxetine  at 60 mg daily.  Hypertension Chronic, stable.  Continue lisinopril  and hydrochlorothiazide  for blood pressure management.  Mixed hyperlipidemia Currently asymptomatic.  Patient would like to proceed with calcium CT cardiac scoring.  Nocturia Reports nocturia, four to five times nightly. No significant improvement with finasteride  and Flomax .  Patient does report snoring.  Considered referral for sleep study; patient declined for the time being due to concerns for financial constraints.    General Health Maintenance Interested in calcium heart exam for cardiovascular risk. - Order calcium heart exam at outpatient imaging center.  Return in about 3 months (around 07/06/2024) for Chronic f/u.      I discussed the assessment and treatment plan with the patient  The patient was provided an  opportunity to ask questions and all were answered. The patient agreed with the plan and demonstrated an understanding of the instructions.   The patient was advised to call back or seek an in-person evaluation if the symptoms worsen or if the condition fails to improve as anticipated.    LAURAINE LOISE BUOY, DO  [facility] [phone] (phone) [phone] (fax)  [facility]

## 2024-05-11 ENCOUNTER — Other Ambulatory Visit

## 2024-07-06 ENCOUNTER — Ambulatory Visit: Admitting: Family Medicine

## 2024-07-06 ENCOUNTER — Encounter: Payer: Self-pay | Admitting: Family Medicine

## 2024-07-06 VITALS — BP 109/72 | HR 71 | Wt 256.5 lb

## 2024-07-06 DIAGNOSIS — I10 Essential (primary) hypertension: Secondary | ICD-10-CM

## 2024-07-06 DIAGNOSIS — N182 Chronic kidney disease, stage 2 (mild): Secondary | ICD-10-CM | POA: Diagnosis not present

## 2024-07-06 DIAGNOSIS — F331 Major depressive disorder, recurrent, moderate: Secondary | ICD-10-CM | POA: Diagnosis not present

## 2024-07-06 DIAGNOSIS — F339 Major depressive disorder, recurrent, unspecified: Secondary | ICD-10-CM

## 2024-07-06 DIAGNOSIS — M129 Arthropathy, unspecified: Secondary | ICD-10-CM | POA: Diagnosis not present

## 2024-07-06 MED ORDER — MELOXICAM 15 MG PO TABS: 15.0000 mg | ORAL_TABLET | Freq: Every day | ORAL | 0 refills | Status: AC

## 2024-07-06 MED ORDER — HYDROCHLOROTHIAZIDE 12.5 MG PO CAPS
12.5000 mg | ORAL_CAPSULE | Freq: Every day | ORAL | 1 refills | Status: AC
Start: 2024-07-06 — End: ?

## 2024-07-06 NOTE — Progress Notes (Unsigned)
 Established patient visit   Patient: Fernando Arias   DOB: 01-29-1961   63 y.o. Male  MRN: [id number] Visit Date: 07/06/2024  Today's healthcare provider: LAURAINE LOISE BUOY, DO   Chief Complaint  Patient presents with   Medical Management of Chronic Issues   Subjective    HPI Jefry Lesinski is a 63 year old male who presents for a general follow-up visit.  He did not undergo the sleep study or the calcium score test. The calcium score test was not done due to cost concerns. He is not experiencing any specific concerns at this time.  He is taking duloxetine , which he describes as a 'wonder drug' and expresses satisfaction with its effects. He is also on lisinopril  and hydrochlorothiazide  for blood pressure management, and meloxicam  for pain. He uses cyclobenzaprine  as needed for muscle relaxation.  He has a history of an ulcer caused by [facility] powders, which resulted in significant abdominal pain rated as an eight out of ten. The pain was alleviated by eating. He stopped taking BC powders three months ago, and the ulcer has since healed.  He has not been physically active outside of work due to lack of motivation, although his job involves a lot of movement. He has not been monitoring his caloric intake.  No current chest pain, shortness of breath, or significant gastrointestinal symptoms. He had a severe cold last year, which was treated with azithromycin . He is not currently on any medication for cholesterol.   ***  {History (Optional):23778}  Medications: Outpatient Medications Prior to Visit  Medication Sig   calcium carbonate (OS-CAL) 1250 (500 Ca) MG chewable tablet Chew 1 tablet by mouth daily.   cyclobenzaprine  (FLEXERIL ) 5 MG tablet Take 1-2 tablets (5-10 mg total) by mouth 2 (two) times daily as needed for muscle spasms.   DULoxetine  (CYMBALTA ) 60 MG capsule Take 1 capsule (60 mg total) by mouth daily.   lisinopril  (ZESTRIL ) 40 MG tablet Take 1 tablet (40 mg total) by  mouth daily.   Multiple Vitamins-Minerals (CENTRUM SILVER 50+MEN) TABS Take 1 tablet by mouth daily.   [DISCONTINUED] hydrochlorothiazide  (MICROZIDE ) 12.5 MG capsule Take 1 capsule (12.5 mg total) by mouth daily.   [DISCONTINUED] meloxicam  (MOBIC ) 15 MG tablet Take 1 tablet (15 mg total) by mouth daily.   [DISCONTINUED] Aspirin-Salicylamide-Caffeine (BC HEADACHE POWDER PO) Take 1 packet by mouth daily as needed.   [DISCONTINUED] Semaglutide -Weight Management 0.5 MG/0.5ML SOAJ Inject 0.5 mg into the skin once a week.   No facility-administered medications prior to visit.    Review of Systems  Respiratory: Negative.  Negative for cough, shortness of breath and wheezing.   Cardiovascular:  Negative for chest pain, palpitations and leg swelling.  Gastrointestinal:  Negative for abdominal pain, nausea and vomiting.  Musculoskeletal:  Positive for arthralgias (well-controlled with current medications).  Neurological:  Negative for weakness and headaches.    {Insert previous labs (optional):23779} {See past labs  Heme  Chem  Endocrine  Serology  Results Review (optional):1}   Objective    BP 109/72 (BP Location: Left Arm, Patient Position: Sitting, Cuff Size: Normal)   Pulse 71   Wt 256 lb 8 oz (116.3 kg)   SpO2 98%   BMI 32.93 kg/m  {Insert last BP/Wt (optional):23777}{See vitals history (optional):1}   Physical Exam Constitutional:      Appearance: Normal appearance.  HENT:     Head: Normocephalic and atraumatic.  Eyes:     General: No scleral icterus.    Extraocular  Movements: Extraocular movements intact.     Conjunctiva/sclera: Conjunctivae normal.  Cardiovascular:     Rate and Rhythm: Normal rate and regular rhythm.     Pulses: Normal pulses.     Heart sounds: Normal heart sounds.  Pulmonary:     Effort: Pulmonary effort is normal. No respiratory distress.     Breath sounds: Normal breath sounds.  Abdominal:     General: Bowel sounds are normal. There is no  distension.     Palpations: Abdomen is soft. There is no mass.     Tenderness: There is no abdominal tenderness. There is no guarding.  Musculoskeletal:     Right lower leg: No edema.     Left lower leg: No edema.  Skin:    General: Skin is warm and dry.  Neurological:     Mental Status: He is alert and oriented to person, place, and time. Mental status is at baseline.  Psychiatric:        Mood and Affect: Mood normal.        Behavior: Behavior normal.      Results for orders placed or performed in visit on 07/06/24  Basic Metabolic Panel (BMET)  Result Value Ref Range   Glucose 83 70 - 99 mg/dL   BUN 24 8 - 27 mg/dL   Creatinine, Ser 8.29 (H) 0.76 - 1.27 mg/dL   eGFR 45 (L) >40 fO/fpw/8.26   BUN/Creatinine Ratio 14 10 - 24   Sodium 140 134 - 144 mmol/L   Potassium 5.5 (H) 3.5 - 5.2 mmol/L   Chloride 104 96 - 106 mmol/L   CO2 22 20 - 29 mmol/L   Calcium 9.5 8.6 - 10.2 mg/dL    Assessment & Plan    Primary hypertension -     Basic metabolic panel with GFR -     hydroCHLOROthiazide ; Take 1 capsule (12.5 mg total) by mouth daily.  Dispense: 90 capsule; Refill: 1  Depression, recurrent  Chronic kidney disease, stage 2, mildly decreased GFR -     Basic metabolic panel with GFR  Arthritis, multiple joint involvement -     Meloxicam ; Take 1 tablet (15 mg total) by mouth daily.  Dispense: 90 tablet; Refill: 0     Hypertension Continue lisinopril  and hydrochlorothiazide .  - Ensure refills for lisinopril  and hydrochlorothiazide .  Chronic pain Takes meloxicam  and cyclobenzaprine  as needed. Reports improved mobility and pain control. - Ensure refills for meloxicam  and cyclobenzaprine .  Major depressive disorder Takes duloxetine . Reports effectiveness with improved mobility and reduced pain. - Ensure refills for duloxetine .  Peptic ulcer disease Resolved after discontinuation of BC powder. - Advised against BC powder use to prevent recurrence. ***  Return in about 6  months (around 01/03/2025) for CPE.      I discussed the assessment and treatment plan with the patient  The patient was provided an opportunity to ask questions and all were answered. The patient agreed with the plan and demonstrated an understanding of the instructions.   The patient was advised to call back or seek an in-person evaluation if the symptoms worsen or if the condition fails to improve as anticipated.    LAURAINE LOISE BUOY, DO  [facility] [phone] (phone) [phone] (fax)  [facility]

## 2024-07-07 LAB — BASIC METABOLIC PANEL WITH GFR
BUN/Creatinine Ratio: 14 (ref 10–24)
BUN: 24 mg/dL (ref 8–27)
CO2: 22 mmol/L (ref 20–29)
Calcium: 9.5 mg/dL (ref 8.6–10.2)
Chloride: 104 mmol/L (ref 96–106)
Creatinine, Ser: 1.7 mg/dL — ABNORMAL HIGH (ref 0.76–1.27)
Glucose: 83 mg/dL (ref 70–99)
Potassium: 5.5 mmol/L — ABNORMAL HIGH (ref 3.5–5.2)
Sodium: 140 mmol/L (ref 134–144)
eGFR: 45 mL/min/1.73 — ABNORMAL LOW (ref >59)

## 2024-07-15 ENCOUNTER — Encounter: Payer: Self-pay | Admitting: Family Medicine

## 2024-07-15 ENCOUNTER — Ambulatory Visit: Payer: Self-pay | Admitting: Family Medicine

## 2024-07-15 DIAGNOSIS — N179 Acute kidney failure, unspecified: Secondary | ICD-10-CM

## 2024-07-15 DIAGNOSIS — R944 Abnormal results of kidney function studies: Secondary | ICD-10-CM

## 2025-01-04 ENCOUNTER — Encounter: Admitting: Family Medicine
